# Patient Record
Sex: Male | Born: 1980
Health system: Southern US, Community
[De-identification: ages and names within clinical notes are randomized; demographics above are authoritative.]

## PROBLEM LIST (undated history)

## (undated) HISTORY — PX: LUNG SURGERY: SHX703

## (undated) HISTORY — PX: KNEE SURGERY: SHX244

---

## 2013-02-05 LAB — LIPID PANEL
CHOLESTEROL: 168 mg/dL (ref 0–200)
HDL: 39 mg/dL (ref 35–70)
LDL Cholesterol: 110 mg/dL
TRIGLYCERIDES: 96 mg/dL (ref 40–160)

## 2013-06-21 LAB — BASIC METABOLIC PANEL
BUN: 13 mg/dL (ref 4–21)
Creatinine: 1.1 mg/dL (ref 0.6–1.3)
Glucose: 88 mg/dL
Potassium: 4.5 mmol/L (ref 3.4–5.3)
Sodium: 143 mmol/L (ref 137–147)

## 2013-06-21 LAB — CBC AND DIFFERENTIAL
HCT: 45 % (ref 41–53)
Hemoglobin: 15.6 g/dL (ref 13.5–17.5)
Platelets: 292 10*3/uL (ref 150–399)
WBC: 6.9 10*3/mL

## 2013-06-21 LAB — TSH: TSH: 1.67 u[IU]/mL (ref 0.41–5.90)

## 2013-06-21 LAB — HEPATIC FUNCTION PANEL
ALT: 35 U/L (ref 10–40)
AST: 23 U/L (ref 14–40)

## 2013-06-21 LAB — HEMOGLOBIN A1C: HEMOGLOBIN A1C: 5.2 % (ref 4.0–6.0)

## 2014-10-29 ENCOUNTER — Other Ambulatory Visit: Payer: Self-pay | Admitting: Family Medicine

## 2014-10-29 DIAGNOSIS — M545 Low back pain: Secondary | ICD-10-CM

## 2014-12-06 DIAGNOSIS — R42 Dizziness and giddiness: Secondary | ICD-10-CM | POA: Insufficient documentation

## 2014-12-06 DIAGNOSIS — F32A Depression, unspecified: Secondary | ICD-10-CM | POA: Insufficient documentation

## 2014-12-06 DIAGNOSIS — R079 Chest pain, unspecified: Secondary | ICD-10-CM | POA: Insufficient documentation

## 2014-12-06 DIAGNOSIS — J309 Allergic rhinitis, unspecified: Secondary | ICD-10-CM | POA: Insufficient documentation

## 2014-12-06 DIAGNOSIS — F419 Anxiety disorder, unspecified: Secondary | ICD-10-CM | POA: Insufficient documentation

## 2014-12-06 DIAGNOSIS — F329 Major depressive disorder, single episode, unspecified: Secondary | ICD-10-CM | POA: Insufficient documentation

## 2014-12-06 DIAGNOSIS — IMO0001 Reserved for inherently not codable concepts without codable children: Secondary | ICD-10-CM | POA: Insufficient documentation

## 2014-12-06 DIAGNOSIS — R030 Elevated blood-pressure reading, without diagnosis of hypertension: Secondary | ICD-10-CM

## 2014-12-06 DIAGNOSIS — B360 Pityriasis versicolor: Secondary | ICD-10-CM | POA: Insufficient documentation

## 2014-12-06 DIAGNOSIS — R739 Hyperglycemia, unspecified: Secondary | ICD-10-CM | POA: Insufficient documentation

## 2014-12-10 ENCOUNTER — Encounter: Payer: Self-pay | Admitting: Family Medicine

## 2014-12-17 ENCOUNTER — Ambulatory Visit (INDEPENDENT_AMBULATORY_CARE_PROVIDER_SITE_OTHER): Payer: BLUE CROSS/BLUE SHIELD | Admitting: Family Medicine

## 2014-12-17 ENCOUNTER — Encounter: Payer: Self-pay | Admitting: Family Medicine

## 2014-12-17 VITALS — BP 128/60 | HR 63 | Temp 98.5°F | Resp 16 | Ht 71.5 in | Wt 200.0 lb

## 2014-12-17 DIAGNOSIS — Z0000 Encounter for general adult medical examination without abnormal findings: Secondary | ICD-10-CM | POA: Diagnosis not present

## 2014-12-17 DIAGNOSIS — F329 Major depressive disorder, single episode, unspecified: Secondary | ICD-10-CM | POA: Diagnosis not present

## 2014-12-17 DIAGNOSIS — M545 Low back pain, unspecified: Secondary | ICD-10-CM | POA: Insufficient documentation

## 2014-12-17 DIAGNOSIS — M7652 Patellar tendinitis, left knee: Secondary | ICD-10-CM | POA: Diagnosis not present

## 2014-12-17 DIAGNOSIS — F32A Depression, unspecified: Secondary | ICD-10-CM

## 2014-12-17 DIAGNOSIS — E559 Vitamin D deficiency, unspecified: Secondary | ICD-10-CM | POA: Diagnosis not present

## 2014-12-17 DIAGNOSIS — F419 Anxiety disorder, unspecified: Secondary | ICD-10-CM | POA: Diagnosis not present

## 2014-12-17 NOTE — Progress Notes (Signed)
Patient: Brian Kirk, Male    DOB: 1980-11-14, 34 y.o.   MRN: [id number] Visit Date: 12/17/2014  Today's Provider: Mila Merry, MD   Chief Complaint  Patient presents with  . Annual Exam  . Anxiety    follow up  . Depression    follow up  . Hyperglycemia    follow up   Subjective:    Annual physical exam Brian Kirk is a 34 y.o. male who presents today for health maintenance and complete physical. He feels well. He reports exercising  3 times a week. He reports he is sleeping fairly well.  -----------------------------------------------------------------   Anxiety/ Depression Follow up: Last visit was 2 months ago and no changes were made. Patient is currently taking Sertraline  daily. Patient reports good compliance with treatment, good tolerance and good symptoms control. He states his mood has been very good on this medication and feels much less anxious.   Review of Systems  Constitutional: Negative for fever, chills, appetite change and fatigue.  HENT: Negative for congestion, ear pain, hearing loss, nosebleeds and trouble swallowing.   Eyes: Negative for pain and visual disturbance.  Respiratory: Negative for cough, chest tightness and shortness of breath.   Cardiovascular: Negative for chest pain, palpitations and leg swelling.  Gastrointestinal: Negative for nausea, vomiting, abdominal pain, diarrhea, constipation and blood in stool.  Endocrine: Negative for polydipsia, polyphagia and polyuria.  Genitourinary: Negative for dysuria and flank pain.  Musculoskeletal: Positive for back pain and arthralgias (left knee pain; started 1 month ago). Negative for myalgias, joint swelling and neck stiffness.  Skin: Negative for color change, rash and wound.  Neurological: Negative for dizziness, tremors, seizures, speech difficulty, weakness, light-headedness and headaches.  Psychiatric/Behavioral: Negative for behavioral problems, confusion, sleep disturbance,  dysphoric mood and decreased concentration. The patient is not nervous/anxious.   All other systems reviewed and are negative.   Social History He  reports that he has never smoked. He does not have any smokeless tobacco history on file. He reports that he drinks alcohol. He reports that he does not use illicit drugs.  Patient Active Problem List   Diagnosis Date Noted  . Allergic rhinitis 12/06/2014  . Anxiety 12/06/2014  . Chest pain 12/06/2014  . Clinical depression 12/06/2014  . Dizziness 12/06/2014  . Blood pressure elevated 12/06/2014  . Elevated blood sugar 12/06/2014  . Chromophytosis 12/06/2014    Past Surgical History  Procedure Laterality Date  . Knee surgery  Age 65 or 36    Orthoscopic  . Lung surgery      Lungs collapsed as a child    Family History His family history includes Breast cancer in his mother; Healthy in his sister; Hypertension in his father; Kidney disease in his father; Testicular cancer in his father.    Allergies  Allergen Reactions  . Tomato Flavor  [Flavoring Agent] Rash    Previous Medications   CHOLECALCIFEROL (VITAMIN D) 2000 UNITS CAPS    Take 2 capsules by mouth daily.   CYANOCOBALAMIN 1000 MCG TABLET    Take 1 tablet by mouth daily.   MULTIPLE VITAMIN PO    Take 1 capsule by mouth daily.   NAPROXEN (NAPROSYN) 500 MG TABLET    Take by mouth.   SERTRALINE (ZOLOFT) 100 MG TABLET    Take 1 tablet by mouth daily.   TIZANIDINE (ZANAFLEX) 4 MG TABLET    Take by mouth.    Patient Care Team: Malva Limes, MD as PCP -  General (Family Medicine)     Objective:   Vitals: BP 128/60 mmHg  Pulse 63  Temp(Src) 98.5 F (36.9 C) (Oral)  Resp 16  Ht 5' 11.5" (1.816 m)  Wt 200 lb (90.719 kg)  BMI 27.51 kg/m2  SpO2 97%   Physical Exam  General Appearance:    Alert, cooperative, no distress, appears stated age  Head:    Normocephalic, without obvious abnormality, atraumatic  Eyes:    PERRL, conjunctiva/corneas clear, EOM's intact,  fundi    benign, both eyes       Ears:    Normal TM's and external ear canals, both ears  Nose:   Nares normal, septum midline, mucosa normal, no drainage   or sinus tenderness  Throat:   Lips, mucosa, and tongue normal; teeth and gums normal  Neck:   Supple, symmetrical, trachea midline, no adenopathy;       thyroid:  No enlargement/tenderness/nodules; no carotid   bruit or JVD  Back:     Symmetric, no curvature, ROM normal, no CVA tenderness  Lungs:     Clear to auscultation bilaterally, respirations unlabored  Chest wall:    No tenderness or deformity  Heart:    Regular rate and rhythm, S1 and S2 normal, no murmur, rub   or gallop  Abdomen:     Soft, non-tender, bowel sounds active all four quadrants,    no masses, no organomegaly  Genitalia:    normal  Rectal:    deferred  Extremities:   Extremities normal, atraumatic, no cyanosis or edema. Tender left patellar tendon at inferior insertion to patella. Pain reproduced with extension against resistance.   Pulses:   2+ and symmetric all extremities  Skin:   Skin color, texture, turgor normal, no rashes or lesions  Lymph nodes:   Cervical, supraclavicular, and axillary nodes normal  Neurologic:   CNII-XII intact. Normal strength, sensation and reflexes      throughout     Depression Screen PHQ 2/9 Scores 12/17/2014  PHQ - 2 Score 0  PHQ- 9 Score 0      Assessment & Plan:     Routine Health Maintenance and Physical Exam  Exercise Activities and Dietary recommendations Goals    None      Immunization History  Administered Date(s) Administered  . Tdap 01/26/2013    Health Maintenance  Topic Date Due  . HIV Screening  11/16/1997  . INFLUENZA VACCINE  12/30/2014  . TETANUS/TDAP  01/27/2023      Discussed health benefits of physical activity, and encouraged him to engage in regular exercise appropriate for his age and condition.    --------------------------------------------------------------------  1. Annual  physical exam  - Basic metabolic panel - Lipid panel - HIV antibody (with reflex)  2. Depression Has done well on sertraline and is going to reduce to 1/2 tablet daily for awhile.   3. Anxiety   4. Bilateral low back pain without sciatica He has order for XR and plans on going to [facility]  5. Vitamin D deficiency Doing well on vitamin D supplements - Vit D  25 hydroxy (rtn osteoporosis monitoring)  6. Patellar tendonitis of left knee Recommend frequent icing throughout the day. Consider orthopedic referral if conservative measures are not effective.

## 2015-01-26 ENCOUNTER — Other Ambulatory Visit: Payer: Self-pay | Admitting: Family Medicine

## 2015-04-09 ENCOUNTER — Encounter: Payer: Self-pay | Admitting: Family Medicine

## 2015-04-09 ENCOUNTER — Ambulatory Visit
Admission: RE | Admit: 2015-04-09 | Discharge: 2015-04-09 | Disposition: A | Discharge status: Home / Self Care | Payer: BLUE CROSS/BLUE SHIELD | Source: Ambulatory Visit | Attending: Family Medicine | Admitting: Family Medicine

## 2015-04-09 ENCOUNTER — Ambulatory Visit (INDEPENDENT_AMBULATORY_CARE_PROVIDER_SITE_OTHER): Payer: BLUE CROSS/BLUE SHIELD | Admitting: Family Medicine

## 2015-04-09 VITALS — BP 104/66 | HR 101 | Temp 99.7°F | Resp 16 | Ht 71.0 in | Wt 203.0 lb

## 2015-04-09 DIAGNOSIS — M545 Low back pain, unspecified: Secondary | ICD-10-CM

## 2015-04-09 DIAGNOSIS — Z113 Encounter for screening for infections with a predominantly sexual mode of transmission: Secondary | ICD-10-CM | POA: Diagnosis not present

## 2015-04-09 DIAGNOSIS — E559 Vitamin D deficiency, unspecified: Secondary | ICD-10-CM

## 2015-04-09 DIAGNOSIS — Z13220 Encounter for screening for lipoid disorders: Secondary | ICD-10-CM

## 2015-04-09 DIAGNOSIS — R509 Fever, unspecified: Secondary | ICD-10-CM | POA: Diagnosis not present

## 2015-04-09 LAB — POCT URINALYSIS DIPSTICK
Blood, UA: NEGATIVE
Glucose, UA: NEGATIVE
Leukocytes, UA: NEGATIVE
Nitrite, UA: NEGATIVE
Protein, UA: 100
SPEC GRAV UA: 1.025
Urobilinogen, UA: 1
pH, UA: 6

## 2015-04-09 LAB — POCT INFLUENZA A/B
INFLUENZA A, POC: NEGATIVE
Influenza B, POC: NEGATIVE

## 2015-04-09 NOTE — Progress Notes (Signed)
Subjective:     Patient ID: Brian Kirk, male   DOB: 1980-12-12, 34 y.o.   MRN: [id number]  HPI  Chief Complaint  Patient presents with  . Back Pain    Patient comes in office today with conerns of lower back pain for the past two weeks, patient denies any injury or accident. He reports that last night he developed fever and body chills, he denies cold like symptoms or urinary/GI upset.   Reports malaise with intermittent back pain for the last two weeks. In the last 24 hours reports fever as high as 103-104 with chills and acute exacerbation of his lower back pain-"It dropped me to my knees"- and paresthesias of his bilateral thighs. 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 Did not complete screening labs after physical this past summer or LS spine x-ray. No flu shot this season.   Review of Systems  Genitourinary: Negative for dysuria.       Objective:   Physical Exam  Constitutional: He appears well-developed and well-nourished. No distress.  Pulmonary/Chest: Breath sounds normal.  Abdominal: Soft. There is no tenderness. There is no guarding.  Musculoskeletal:  Mild tenderness over bilateral SI areas with loss of lordosis. Muscle strength in lower extremities 5/5. SLR's to 90 degrees without radiation of back pain.       Assessment:    1. Fever and chills - POCT urinalysis dipstick - CBC with Differential/Platelet - Sedimentation rate - Comprehensive metabolic panel - POCT Influenza A/B  2. Bilateral low back pain without sciatica - CBC with Differential/Platelet - Sedimentation rate - Comprehensive metabolic panel  3. Screening for hyperlipidemia - Lipid panel  4. Screen for STD (sexually transmitted disease) - HIV antibody    Plan:    Discussed use of ibuprofen for his sx pending review of lab work.

## 2015-04-09 NOTE — Patient Instructions (Addendum)
We will call you with lab and x-ray results. Try ibuprofen 400 mg 3 x day for back pain pending review of x-ray and labs.

## 2015-04-10 ENCOUNTER — Other Ambulatory Visit: Payer: Self-pay | Admitting: Family Medicine

## 2015-04-10 DIAGNOSIS — B349 Viral infection, unspecified: Secondary | ICD-10-CM

## 2015-04-10 LAB — COMPREHENSIVE METABOLIC PANEL
ALK PHOS: 60 IU/L (ref 39–117)
ALT: 27 IU/L (ref 0–44)
AST: 21 IU/L (ref 0–40)
Albumin/Globulin Ratio: 1.9 (ref 1.1–2.5)
Albumin: 4.6 g/dL (ref 3.5–5.5)
BUN/Creatinine Ratio: 10 (ref 8–19)
BUN: 13 mg/dL (ref 6–20)
Bilirubin Total: 1.1 mg/dL (ref 0.0–1.2)
CO2: 27 mmol/L (ref 18–29)
CREATININE: 1.24 mg/dL (ref 0.76–1.27)
Calcium: 9.7 mg/dL (ref 8.7–10.2)
Chloride: 98 mmol/L (ref 97–106)
GFR calc Af Amer: 88 mL/min/{1.73_m2} (ref >59)
GFR calc non Af Amer: 76 mL/min/{1.73_m2} (ref >59)
GLOBULIN, TOTAL: 2.4 g/dL (ref 1.5–4.5)
Glucose: 101 mg/dL — ABNORMAL HIGH (ref 65–99)
POTASSIUM: 4.7 mmol/L (ref 3.5–5.2)
SODIUM: 139 mmol/L (ref 136–144)
Total Protein: 7 g/dL (ref 6.0–8.5)

## 2015-04-10 LAB — LIPID PANEL
CHOL/HDL RATIO: 3.7 ratio (ref 0.0–5.0)
Cholesterol, Total: 157 mg/dL (ref 100–199)
HDL: 43 mg/dL (ref >39)
LDL CALC: 92 mg/dL (ref 0–99)
Triglycerides: 108 mg/dL (ref 0–149)
VLDL CHOLESTEROL CAL: 22 mg/dL (ref 5–40)

## 2015-04-10 LAB — CBC WITH DIFFERENTIAL/PLATELET
BASOS ABS: 0 10*3/uL (ref 0.0–0.2)
Basos: 0 %
EOS (ABSOLUTE): 0 10*3/uL (ref 0.0–0.4)
EOS: 0 %
HEMATOCRIT: 47 % (ref 37.5–51.0)
Hemoglobin: 16 g/dL (ref 12.6–17.7)
Immature Grans (Abs): 0 10*3/uL (ref 0.0–0.1)
Immature Granulocytes: 0 %
LYMPHS ABS: 0.6 10*3/uL — AB (ref 0.7–3.1)
Lymphs: 7 %
MCH: 29.4 pg (ref 26.6–33.0)
MCHC: 34 g/dL (ref 31.5–35.7)
MCV: 86 fL (ref 79–97)
MONOS ABS: 1 10*3/uL — AB (ref 0.1–0.9)
Monocytes: 11 %
Neutrophils Absolute: 7 10*3/uL (ref 1.4–7.0)
Neutrophils: 82 %
PLATELETS: 247 10*3/uL (ref 150–379)
RBC: 5.44 x10E6/uL (ref 4.14–5.80)
RDW: 13.6 % (ref 12.3–15.4)
WBC: 8.6 10*3/uL (ref 3.4–10.8)

## 2015-04-10 LAB — VITAMIN D 25 HYDROXY (VIT D DEFICIENCY, FRACTURES): VIT D 25 HYDROXY: 34.9 ng/mL (ref 30.0–100.0)

## 2015-04-10 LAB — HIV ANTIBODY (ROUTINE TESTING W REFLEX): HIV SCREEN 4TH GENERATION: NONREACTIVE

## 2015-04-10 LAB — SEDIMENTATION RATE: SED RATE: 2 mm/h (ref 0–15)

## 2015-04-10 MED ORDER — OSELTAMIVIR PHOSPHATE 75 MG PO CAPS: 75.0000 mg | ORAL_CAPSULE | Freq: Two times a day (BID) | ORAL | Status: DC

## 2016-03-05 ENCOUNTER — Other Ambulatory Visit: Payer: Self-pay | Admitting: Family Medicine

## 2016-06-28 ENCOUNTER — Telehealth: Payer: Self-pay | Admitting: Family Medicine

## 2016-06-28 DIAGNOSIS — B349 Viral infection, unspecified: Secondary | ICD-10-CM

## 2016-06-28 MED ORDER — OSELTAMIVIR PHOSPHATE 75 MG PO CAPS: 75.0000 mg | ORAL_CAPSULE | Freq: Every day | ORAL | 0 refills | Status: AC

## 2016-06-28 NOTE — Telephone Encounter (Signed)
Pt wife called stating pt daughter was Dx yesterday with the flu.  Pt wife is requesting a Rx for [place] flu.  [facility] [address]  CB#[phone]/MW

## 2016-06-28 NOTE — Telephone Encounter (Signed)
Please review. Thanks!  

## 2016-08-08 ENCOUNTER — Other Ambulatory Visit: Payer: Self-pay | Admitting: Family Medicine

## 2016-09-22 ENCOUNTER — Ambulatory Visit (INDEPENDENT_AMBULATORY_CARE_PROVIDER_SITE_OTHER): Payer: Managed Care, Other (non HMO) | Admitting: Family Medicine

## 2016-09-22 ENCOUNTER — Encounter: Payer: Self-pay | Admitting: Family Medicine

## 2016-09-22 VITALS — BP 124/84 | HR 86 | Temp 98.6°F | Resp 16 | Ht 71.0 in | Wt 213.0 lb

## 2016-09-22 DIAGNOSIS — F419 Anxiety disorder, unspecified: Secondary | ICD-10-CM | POA: Diagnosis not present

## 2016-09-22 DIAGNOSIS — Z0000 Encounter for general adult medical examination without abnormal findings: Secondary | ICD-10-CM

## 2016-09-22 DIAGNOSIS — B356 Tinea cruris: Secondary | ICD-10-CM

## 2016-09-22 MED ORDER — ALPRAZOLAM 0.5 MG PO TABS: 0.5000 mg | ORAL_TABLET | Freq: Three times a day (TID) | ORAL | 0 refills | Status: DC | PRN

## 2016-09-22 MED ORDER — CLOTRIMAZOLE-BETAMETHASONE 1-0.05 % EX CREA: 1.0000 "application " | TOPICAL_CREAM | Freq: Two times a day (BID) | CUTANEOUS | 0 refills | Status: DC

## 2016-09-22 MED ORDER — SERTRALINE HCL 100 MG PO TABS: 100.0000 mg | ORAL_TABLET | Freq: Every day | ORAL | 12 refills | Status: DC

## 2016-09-22 NOTE — Progress Notes (Signed)
Patient: Brian Kirk, Male    DOB: 01/29/1981, 36 y.o.   MRN: [id number] Visit Date: 09/22/2016  Today's Provider: Mila Merry, MD   Chief Complaint  Patient presents with  . Annual Exam  . Anxiety  . Depression   Subjective:    Annual physical exam Brian Kirk is a 35 y.o. male who presents today for health maintenance and complete physical. He feels well. He reports exercising yes/gym. He reports he is sleeping well.  ---------------------------------------------------------------   Depression He continues on sertraline daily which he states is well tolerated and that his wife has advised him helps his mood quite a bit. He wishes to continue on same dose of medication.      Review of Systems  Constitutional: Negative.   HENT: Positive for congestion and sinus pressure.   Eyes: Negative.   Respiratory: Negative.   Cardiovascular: Negative.   Gastrointestinal: Negative.   Endocrine: Negative.   Genitourinary: Negative.   Musculoskeletal: Negative.   Skin: Negative.   Allergic/Immunologic: Negative.   Neurological: Negative.   Hematological: Negative.   Psychiatric/Behavioral: Negative.     Social History      He  reports that he has never smoked. He has never used smokeless tobacco. He reports that he drinks alcohol. He reports that he does not use drugs.       Social History   Social History  . Marital status: Married    Spouse name: N/A  . Number of children: N/A  . Years of education: N/A   Social History Main Topics  . Smoking status: Never Smoker  . Smokeless tobacco: Never Used  . Alcohol use 0.0 oz/week     Comment: rare  . Drug use: No  . Sexual activity: Not Asked   Other Topics Concern  . None   Social History Narrative  . None    History reviewed. No pertinent past medical history.   Patient Active Problem List   Diagnosis Date Noted  . Low back pain 12/17/2014  . Allergic rhinitis 12/06/2014  . Anxiety 12/06/2014    . Depression 12/06/2014  . Blood pressure elevated 12/06/2014  . Chromophytosis 12/06/2014    Past Surgical History:  Procedure Laterality Date  . KNEE SURGERY  Age 16 or 41   Orthoscopic  . LUNG SURGERY     Lungs collapsed as a child    Family History        Family Status  Relation Status  . Mother Deceased   died during surgery  . Father Alive  . Sister Alive        His family history includes Breast cancer in his mother; Healthy in his sister; Hypertension in his father; Kidney disease in his father; Testicular cancer in his father.     Allergies  Allergen Reactions  . Tomato Flavor  [Flavoring Agent] Rash     Current Outpatient Prescriptions:  .  Cholecalciferol (VITAMIN D) 2000 UNITS CAPS, Take 2 capsules by mouth daily., Disp: , Rfl:  .  cyanocobalamin 1000 MCG tablet, Take 1 tablet by mouth daily., Disp: , Rfl:  .  MULTIPLE VITAMIN PO, Take 1 capsule by mouth daily., Disp: , Rfl:  .  sertraline (ZOLOFT) 100 MG tablet, TAKE 1 TABLET BY MOUTH EVERY DAY, Disp: 30 tablet, Rfl: 0 .  tiZANidine (ZANAFLEX) 4 MG tablet, Take by mouth., Disp: , Rfl:    Patient Care Team: Malva Limes, MD as PCP - General (Family Medicine)  Objective:   Vitals: BP 124/84 (BP Location: Right Arm, Patient Position: Sitting, Cuff Size: Large)   Pulse 86   Temp 98.6 F (37 C) (Oral)   Resp 16   Ht  (1.803 m)   Wt 213 lb (96.6 kg)   SpO2 94%   BMI 29.71 kg/m       Physical Exam   General Appearance:    Alert, cooperative, no distress, appears stated age  Head:    Normocephalic, without obvious abnormality, atraumatic  Eyes:    PERRL, conjunctiva/corneas clear, EOM's intact, fundi    benign, both eyes       Ears:    Normal TM's and external ear canals, both ears  Nose:   Nares normal, septum midline, mucosa normal, no drainage   or sinus tenderness  Throat:   Lips, mucosa, and tongue normal; teeth and gums normal  Neck:   Supple, symmetrical, trachea midline,  no adenopathy;       thyroid:  No enlargement/tenderness/nodules; no carotid   bruit or JVD  Back:     Symmetric, no curvature, ROM normal, no CVA tenderness  Lungs:     Clear to auscultation bilaterally, respirations unlabored  Chest wall:    No tenderness or deformity  Heart:    Regular rate and rhythm, S1 and S2 normal, no murmur, rub   or gallop  Abdomen:     Soft, non-tender, bowel sounds active all four quadrants,    no masses, no organomegaly  Genitalia:    deferred  Rectal:    deferred  Extremities:   Extremities normal, atraumatic, no cyanosis or edema  Pulses:   2+ and symmetric all extremities  Skin:   Skin color, texture, turgor normal, no rashes or lesions. Mildly inflamed bilateral groin area.   Lymph nodes:   Cervical, supraclavicular, and axillary nodes normal  Neurologic:   CNII-XII intact. Normal strength, sensation and reflexes      throughout    Depression Screen PHQ 2/9 Scores 09/22/2016 12/17/2014  PHQ - 2 Score 3 0  PHQ- 9 Score 15 0      Assessment & Plan:     Routine Health Maintenance and Physical Exam  Exercise Activities and Dietary recommendations Goals    None      Immunization History  Administered Date(s) Administered  . Tdap 01/26/2013    Health Maintenance  Topic Date Due  . INFLUENZA VACCINE  12/29/2016  . TETANUS/TDAP  01/27/2023  . HIV Screening  Completed     Discussed health benefits of physical activity, and encouraged him to engage in regular exercise appropriate for his age and condition.    --------------------------------------------------------------------  1. Annual physical exam Normal anxiety   2. Anxiety Rarely takes alprazolam, has been several years since last refill and he is now out.  - ALPRAZolam (XANAX) 0.5 MG tablet; Take 1 tablet (0.5 mg total) by mouth every 8 (eight) hours as needed.  Dispense: 30 tablet; Refill: 0 - sertraline (ZOLOFT) 100 MG tablet; Take 1 tablet (100 mg total) by mouth daily.   Dispense: 30 tablet; Refill: 12  3. Tinea cruris  - clotrimazole-betamethasone (LOTRISONE) cream; Apply 1 application topically 2 (two) times daily.  Dispense: 30 g; Refill: 0     Mila Merry, MD  [facility]

## 2017-02-25 ENCOUNTER — Other Ambulatory Visit: Payer: Self-pay | Admitting: Family Medicine

## 2017-02-25 DIAGNOSIS — F419 Anxiety disorder, unspecified: Secondary | ICD-10-CM

## 2017-02-25 MED ORDER — SERTRALINE HCL 100 MG PO TABS: 100.0000 mg | ORAL_TABLET | Freq: Every day | ORAL | 2 refills | Status: DC

## 2017-02-25 NOTE — Telephone Encounter (Signed)
[facility] faxed a request for a 90-day supply for the following medication. Thanks CC  sertraline (ZOLOFT) 100 MG tablet

## 2017-08-18 ENCOUNTER — Encounter: Payer: Self-pay | Admitting: Family Medicine

## 2017-08-18 ENCOUNTER — Ambulatory Visit (INDEPENDENT_AMBULATORY_CARE_PROVIDER_SITE_OTHER): Payer: Managed Care, Other (non HMO) | Admitting: Family Medicine

## 2017-08-18 DIAGNOSIS — R51 Headache: Secondary | ICD-10-CM

## 2017-08-18 DIAGNOSIS — R519 Headache, unspecified: Secondary | ICD-10-CM

## 2017-08-18 MED ORDER — AMITRIPTYLINE HCL 10 MG PO TABS: 10.0000 mg | ORAL_TABLET | Freq: Every day | ORAL | 1 refills | Status: DC

## 2017-08-18 NOTE — Progress Notes (Signed)
Dictation #1 [id number]  [id number]       Patient: Brian Kirk Male    DOB: [birth year]   37 y.o.   MRN: [id number] Visit Date: 08/18/2017  Today's Provider: Mila Merryonald Sylver Vantassell, MD   Chief Complaint  Patient presents with  . Headache   Subjective:    Headache   Associated symptoms include dizziness. Pertinent negatives include no abdominal pain, fever, nausea or vomiting.   He states he started having migraines in his early 6830s, but have been more frequent and much more severe the last 7-8 months. They are always behind his left eye and associated with blurry vision and dizziness. He had thorough exam which he reports as being normal. + photophobia. They now occur 2-3 days a week and last several hours. Sometimes improve when he lies down in a  Dark room. Headaches occasionally wake him at night. No numbness or tingling of extremities. No muscle weakness.      Allergies  Allergen Reactions  . Tomato Flavor  [Flavoring Agent] Rash     Current Outpatient Medications:  .  ALPRAZolam (XANAX) 0.5 MG tablet, Take 1 tablet (0.5 mg total) by mouth every 8 (eight) hours as needed., Disp: 30 tablet, Rfl: 0 .  Cholecalciferol (VITAMIN D) 2000 UNITS CAPS, Take 2 capsules by mouth daily., Disp: , Rfl:  .  cyanocobalamin 1000 MCG tablet, Take 1 tablet by mouth daily., Disp: , Rfl:  .  MULTIPLE VITAMIN PO, Take 1 capsule by mouth daily., Disp: , Rfl:  .  sertraline (ZOLOFT) 100 MG tablet, Take 1 tablet (100 mg total) by mouth daily. (Patient not taking: Reported on 08/18/2017), Disp: 90 tablet, Rfl: 2  Review of Systems  Constitutional: Negative for appetite change, chills and fever.  Respiratory: Negative for chest tightness, shortness of breath and wheezing.   Cardiovascular: Negative for chest pain and palpitations.  Gastrointestinal: Negative for abdominal pain, nausea and vomiting.  Neurological: Positive for dizziness and headaches.    Social History   Tobacco Use  . Smoking  status: Never Smoker  . Smokeless tobacco: Never Used  Substance Use Topics  . Alcohol use: Yes    Alcohol/week: 0.0 oz    Comment: rare   Objective:   BP 128/80 (BP Location: Left Arm, Patient Position: Sitting, Cuff Size: Large)   Pulse 72   Temp 98.2 F (36.8 C)   Resp 16   Wt 215 lb (97.5 kg)   SpO2 96%   BMI 29.99 kg/m  Vitals:   08/18/17 1006  BP: 128/80  Pulse: 72  Resp: 16  Temp: 98.2 F (36.8 C)  SpO2: 96%  Weight: 215 lb (97.5 kg)     Physical Exam   General Appearance:    Alert, cooperative, no distress  Eyes:    PERRL, conjunctiva/corneas clear, EOM's intact       Lungs:     Clear to auscultation bilaterally, respirations unlabored  Heart:    Regular rate and rhythm  Neurologic:   Awake, alert, oriented x 3. No apparent focal neurological           defect.           Assessment & Plan:     1. Intractable episodic headache, unspecified headache type Relative recent onset with change in severity and frequency. Needs neuroimaging to rule out structural etiology.  - MR Brain W Wo Contrast; Future - amitriptyline (ELAVIL) 10 MG tablet; Take 1 tablet (10 mg total) by mouth at bedtime. For 4 nights, then  increase to 2 at bedtime for 4 nights, then 3 at bedtime  Dispense: 60 tablet; Refill: 1       Mila Merry, MD  [facility]

## 2017-08-18 NOTE — Patient Instructions (Signed)
Recurrent Migraine Headache °A migraine headache is very bad, throbbing pain that is usually on one side of your head. Recurrent migraines keep coming back (recurring). Talk with your doctor about what things may bring on (trigger) your migraine headaches. °Follow these instructions at home: °Medicines  °· Take over-the-counter and prescription medicines only as told by your doctor. °· Do not drive or use heavy machinery while taking prescription pain medicine. °Lifestyle  °· Do not use any products that contain nicotine or tobacco, such as cigarettes and e-cigarettes. If you need help quitting, ask your doctor. °· Limit alcohol intake to no more than 1 drink a day for nonpregnant women and 2 drinks a day for men. One drink equals 12 oz of beer, 5 oz of wine, or 1½ oz of hard liquor. °· Get 7-9 hours of sleep each night. °· Lessen any stress in your life. Ask your doctor about ways to lower your stress. °· Stay at a healthy weight. Talk with your doctor if you need help losing weight. °· Get regular exercise. °General instructions  °· Keep a journal to find out if certain things bring on migraine headaches. For example, write down: °¨ What you eat and drink. °¨ How much sleep you get. °¨ Any change to your diet or medicines. °· Lie down in a dark, quiet room when you have a migraine. °· Try placing a cool towel over your head when you have a migraine. °· Keep lights dim if bright lights bother you or make your migraines worse. °· Keep all follow-up visits as told by your doctor. This is important. °Contact a doctor if: °· Medicine does not help your migraines. °· Your pain keeps coming back. °· You have a fever. °· You have weight loss without trying. °Get help right away if: °· Your migraine becomes really bad and medicine does not help. °· You have a stiff neck. °· You have trouble seeing. °· Your muscles are weak or you lose control of your muscles. °· You lose your balance or have trouble walking. °· You feel  like you will pass out (faint) or you pass out. °· You have really bad symptoms that are different than your first symptoms. °· You start having sudden, very bad headaches that last for one second or less, like a thunderclap. °Summary °· A migraine headache is very bad, throbbing pain that is usually on one side of your head. °· Talk with your doctor about what things may bring on (trigger) your migraine headaches. °· Take over-the-counter and prescription medicines only as told by your doctor. °· Lie down in a dark, quiet room when you have a migraine. °· Keep a journal about what you eat and drink, how much sleep you get, and any changes to your medicines. This can help you find out if certain things make you have migraine headaches. °This information is not intended to replace advice given to you by your health care provider. Make sure you discuss any questions you have with your health care provider. °Document Released: 02/24/2008 Document Revised: 04/09/2016 Document Reviewed: 04/09/2016 °Elsevier Interactive Patient Education © 2017 [employer] ° °

## 2017-09-06 ENCOUNTER — Ambulatory Visit
Admission: RE | Admit: 2017-09-06 | Discharge: 2017-09-06 | Disposition: A | Discharge status: Home / Self Care | Payer: Managed Care, Other (non HMO) | Source: Ambulatory Visit | Attending: Family Medicine | Admitting: Family Medicine

## 2017-09-06 DIAGNOSIS — R51 Headache: Secondary | ICD-10-CM | POA: Insufficient documentation

## 2017-09-06 DIAGNOSIS — R519 Headache, unspecified: Secondary | ICD-10-CM

## 2017-09-06 MED ORDER — GADOBENATE DIMEGLUMINE 529 MG/ML IV SOLN
20.0000 mL | Freq: Once | INTRAVENOUS | Status: AC | PRN
  Administered 2017-09-06: 19 mL via INTRAVENOUS

## 2017-09-12 ENCOUNTER — Telehealth: Payer: Self-pay | Admitting: Emergency Medicine

## 2017-09-12 DIAGNOSIS — R51 Headache: Principal | ICD-10-CM

## 2017-09-12 DIAGNOSIS — R519 Headache, unspecified: Secondary | ICD-10-CM

## 2017-09-12 NOTE — Telephone Encounter (Signed)
Pt informed. Pt reports that the medications helps but it makes him too groggy the next day. He can not take the medication daily so his headaches are about the same.

## 2017-09-12 NOTE — Telephone Encounter (Signed)
-----   Message from Malva Limesonald E Fisher, MD sent at 09/09/2017  7:55 AM EDT ----- MRI is normal. Please see if headaches are better since starting amitriptyline and how much he is taking.

## 2017-09-13 NOTE — Telephone Encounter (Signed)
Try change to topiramate 25mg  tab, 1 at bedtime for 1week, then 1 twice a day for 1week, then one QAM and 2 QPM. #60, rf x 0. Follow up o.v. In 3-4 weeks.

## 2017-09-13 NOTE — Telephone Encounter (Signed)
LMOVM for pt to return call 

## 2017-09-15 MED ORDER — TOPIRAMATE 25 MG PO TABS: ORAL_TABLET | ORAL | 0 refills | Status: DC

## 2017-09-15 NOTE — Telephone Encounter (Signed)
Patient advised and agrees to try changing medication. Prescription sent into the pharmacy. Follow up appointment scheduled 10/14/2017 at 2pm.

## 2017-10-10 ENCOUNTER — Encounter: Payer: Self-pay | Admitting: Family Medicine

## 2017-10-10 ENCOUNTER — Ambulatory Visit (INDEPENDENT_AMBULATORY_CARE_PROVIDER_SITE_OTHER): Payer: Managed Care, Other (non HMO) | Admitting: Family Medicine

## 2017-10-10 VITALS — BP 122/82 | HR 117 | Temp 100.2°F | Resp 16 | Wt 210.0 lb

## 2017-10-10 DIAGNOSIS — J020 Streptococcal pharyngitis: Secondary | ICD-10-CM | POA: Diagnosis not present

## 2017-10-10 LAB — POCT RAPID STREP A (OFFICE): RAPID STREP A SCREEN: POSITIVE — AB

## 2017-10-10 MED ORDER — AMOXICILLIN 250 MG/5ML PO SUSR: 500.0000 mg | Freq: Two times a day (BID) | ORAL | 0 refills | Status: AC

## 2017-10-10 NOTE — Progress Notes (Signed)
Patient: Brian Kirk Male    DOB: 01/06/1981   37 y.o.   MRN: [id number] Visit Date: 10/10/2017  Today's Provider: Mila Merry, MD   No chief complaint on file.  Subjective:    Sore Throat   This is a new problem. Episode onset: 2 days ago. The problem has been gradually worsening. Neither side of throat is experiencing more pain than the other. Associated symptoms include headaches, a hoarse voice, swollen glands and trouble swallowing (hurts to swallow). Pertinent negatives include no abdominal pain, congestion, coughing or vomiting. He has tried nothing for the symptoms.      Allergies  Allergen Reactions  . Tomato Flavor  [Flavoring Agent] Rash     Current Outpatient Medications:  .  ALPRAZolam (XANAX) 0.5 MG tablet, Take 1 tablet (0.5 mg total) by mouth every 8 (eight) hours as needed., Disp: 30 tablet, Rfl: 0 .  amitriptyline (ELAVIL) 10 MG tablet, Take 1 tablet (10 mg total) by mouth at bedtime. For 4 nights, then increase to 2 at bedtime for 4 nights, then 3 at bedtime, Disp: 60 tablet, Rfl: 1 .  Cholecalciferol (VITAMIN D) 2000 UNITS CAPS, Take 2 capsules by mouth daily., Disp: , Rfl:  .  cyanocobalamin 1000 MCG tablet, Take 1 tablet by mouth daily., Disp: , Rfl:  .  MULTIPLE VITAMIN PO, Take 1 capsule by mouth daily., Disp: , Rfl:  .  sertraline (ZOLOFT) 100 MG tablet, Take 1 tablet (100 mg total) by mouth daily., Disp: 90 tablet, Rfl: 2 .  topiramate (TOPAMAX) 25 MG tablet, 1 at bedtime for 1week, then 1 twice a day for 1week, then one QAM and 2 QPM, Disp: 60 tablet, Rfl: 0  Review of Systems  Constitutional: Positive for diaphoresis and fatigue. Negative for appetite change.  HENT: Positive for hoarse voice and trouble swallowing (hurts to swallow). Negative for congestion, sinus pressure, sinus pain and sneezing.   Respiratory: Negative for cough and chest tightness.   Cardiovascular: Negative for palpitations.  Gastrointestinal: Negative for abdominal  pain, nausea and vomiting.  Neurological: Positive for headaches.    Social History   Tobacco Use  . Smoking status: Never Smoker  . Smokeless tobacco: Never Used  Substance Use Topics  . Alcohol use: Yes    Alcohol/week: 0.0 oz    Comment: rare   Objective:   BP 122/82 (BP Location: Left Arm, Patient Position: Sitting, Cuff Size: Large)   Pulse (!) 117   Temp 100.2 F (37.9 C) (Oral)   Resp 16   Wt 210 lb (95.3 kg)   SpO2 98% Comment: room air  BMI 29.29 kg/m     Physical Exam  General Appearance:    Alert, cooperative, no distress  HENT:   bilateral TM normal without fluid or infection, neck has bilateral anterior cervical nodes enlarged, tonsils red, enlarged, without exudate present and sinuses nontender  Eyes:    PERRL, conjunctiva/corneas clear, EOM's intact       Lungs:     Clear to auscultation bilaterally, respirations unlabored  Heart:    Regular rate and rhythm  Neurologic:   Awake, alert, oriented x 3. No apparent focal neurological           defect.       Results for orders placed or performed in visit on 10/10/17  POCT rapid strep A  Result Value Ref Range   Rapid Strep A Screen Positive (A) Negative        Assessment &  Plan:     1. Strep pharyngitis  - POCT rapid strep A - amoxicillin (AMOXIL) 250 MG/5ML suspension; Take 10 mLs (500 mg total) by mouth 2 (two) times daily for 10 days.  Dispense: 150 mL; Refill: 0 . Call if symptoms change or if not rapidly improving.          Mila Merry, MD  [facility]

## 2017-10-14 ENCOUNTER — Ambulatory Visit: Payer: Self-pay | Admitting: Family Medicine

## 2017-10-14 NOTE — Progress Notes (Deleted)
       Patient: Brian Kirk Male    DOB: [birth year]   37 y.o.   MRN: [id number] Visit Date: 10/14/2017  Today's Provider: Mila Merry, MD   No chief complaint on file.  Subjective:    HPI  Intractable episodic headache, unspecified headache type From 08/18/2017-started amitriptyline 10 mg. MRI ordered and was normal. Changes from amitriptyline to topiramate due to mediation making patient feel groggy.    Allergies  Allergen Reactions  . Tomato Flavor  [Flavoring Agent] Rash     Current Outpatient Medications:  .  ALPRAZolam (XANAX) 0.5 MG tablet, Take 1 tablet (0.5 mg total) by mouth every 8 (eight) hours as needed., Disp: 30 tablet, Rfl: 0 .  amitriptyline (ELAVIL) 10 MG tablet, Take 1 tablet (10 mg total) by mouth at bedtime. For 4 nights, then increase to 2 at bedtime for 4 nights, then 3 at bedtime, Disp: 60 tablet, Rfl: 1 .  amoxicillin (AMOXIL) 250 MG/5ML suspension, Take 10 mLs (500 mg total) by mouth 2 (two) times daily for 10 days., Disp: 150 mL, Rfl: 0 .  Cholecalciferol (VITAMIN D) 2000 UNITS CAPS, Take 2 capsules by mouth daily., Disp: , Rfl:  .  cyanocobalamin 1000 MCG tablet, Take 1 tablet by mouth daily., Disp: , Rfl:  .  MULTIPLE VITAMIN PO, Take 1 capsule by mouth daily., Disp: , Rfl:  .  sertraline (ZOLOFT) 100 MG tablet, Take 1 tablet (100 mg total) by mouth daily., Disp: 90 tablet, Rfl: 2 .  topiramate (TOPAMAX) 25 MG tablet, 1 at bedtime for 1week, then 1 twice a day for 1week, then one QAM and 2 QPM, Disp: 60 tablet, Rfl: 0  Review of Systems  Constitutional: Negative for appetite change, chills and fever.  Respiratory: Negative for chest tightness, shortness of breath and wheezing.   Cardiovascular: Negative for chest pain and palpitations.  Gastrointestinal: Negative for abdominal pain, nausea and vomiting.    Social History   Tobacco Use  . Smoking status: Never Smoker  . Smokeless tobacco: Never Used  Substance Use Topics  . Alcohol use: Yes     Alcohol/week: 0.0 oz    Comment: rare   Objective:   There were no vitals taken for this visit. There were no vitals filed for this visit.   Physical Exam      Assessment & Plan:           Mila Merry, MD  [facility]

## 2017-11-10 NOTE — Progress Notes (Signed)
Patient: Brian Kirk, Male    DOB: Dec 07, 1980, 37 y.o.   MRN: [id number] Visit Date: 11/11/2017  Today's Provider: Mila Merry, MD   Chief Complaint  Patient presents with  . Annual Exam  . Headache  . Anxiety   Subjective:    Annual physical exam Brian Kirk is a 37 y.o. male who presents today for health maintenance and complete physical. He feels fairly well. He reports no re regular exercising. He reports he is sleeping fairly well.  -----------------------------------------------------------------  Headaches From 08/18/2017-started amitriptyline (ELAVIL) 10 MG tablet. MRI ordered, normal. Changed from amitriptyline to topiramate due to amitriptyline making pt feel groggy. However he states he never got around to picking up topiramate prescription and would like to try it now.   Anxiety and Depression He states he continues to take sertraline, 100mg , but frequently misses doses of medications. Sometimes he won't take it until his anxiety level and headaches get worse than usual. He states he feels very fatigued all the time and feels like he could fall asleep any time. He constantly feels anxious and has a hard time staying asleep at night.      Review of Systems  Constitutional: Positive for appetite change and fatigue. Negative for chills and fever.  HENT: Negative for congestion, ear pain, hearing loss, nosebleeds and trouble swallowing.   Eyes: Negative for pain and visual disturbance.  Respiratory: Negative for cough, chest tightness and shortness of breath.   Cardiovascular: Negative for chest pain, palpitations and leg swelling.  Gastrointestinal: Negative for abdominal pain, blood in stool, constipation, diarrhea, nausea and vomiting.  Endocrine: Negative for polydipsia, polyphagia and polyuria.  Genitourinary: Negative for dysuria and flank pain.  Musculoskeletal: Negative for arthralgias, back pain, joint swelling, myalgias and neck stiffness.  Skin:  Negative for color change, rash and wound.  Neurological: Negative for dizziness, tremors, seizures, speech difficulty, weakness, light-headedness and headaches.  Psychiatric/Behavioral: Negative for behavioral problems, confusion, decreased concentration, dysphoric mood and sleep disturbance. The patient is nervous/anxious.   All other systems reviewed and are negative.   Social History      He  reports that he has never smoked. He has never used smokeless tobacco. He reports that he drinks alcohol. He reports that he does not use drugs.       Social History   Socioeconomic History  . Marital status: Married    Spouse name: Not on file  . Number of children: Not on file  . Years of education: Not on file  . Highest education level: Not on file  Occupational History  . Occupation: Games developer    Comment: [employer]  Social Needs  . Financial resource strain: Not on file  . Food insecurity:    Worry: Not on file    Inability: Not on file  . Transportation needs:    Medical: Not on file    Non-medical: Not on file  Tobacco Use  . Smoking status: Never Smoker  . Smokeless tobacco: Never Used  Substance and Sexual Activity  . Alcohol use: Yes    Alcohol/week: 0.0 oz    Comment: rare  . Drug use: No  . Sexual activity: Not on file  Lifestyle  . Physical activity:    Days per week: Not on file    Minutes per session: Not on file  . Stress: Not on file  Relationships  . Social connections:    Talks on phone: Not on file    Gets  together: Not on file    Attends religious service: Not on file    Active member of club or organization: Not on file    Attends meetings of clubs or organizations: Not on file    Relationship status: Not on file  Other Topics Concern  . Not on file  Social History Narrative  . Not on file    No past medical history on file.   Patient Active Problem List   Diagnosis Date Noted  . Low back pain 12/17/2014  . Allergic rhinitis 12/06/2014   . Anxiety 12/06/2014  . Depression 12/06/2014  . Blood pressure elevated 12/06/2014  . Chromophytosis 12/06/2014    Past Surgical History:  Procedure Laterality Date  . KNEE SURGERY  Age 38 or 86   Orthoscopic  . LUNG SURGERY     Lungs collapsed as a child    Family History        Family Status  Relation Name Status  . Mother  Deceased       died during surgery  . Father  Alive  . Sister Twin Sister Alive        His family history includes Breast cancer in his mother; Hypertension in his father; Kidney disease in his father and sister; Testicular cancer in his father.      Allergies  Allergen Reactions  . Tomato Flavor  [Flavoring Agent] Rash     Current Outpatient Medications:  .  ALPRAZolam (XANAX) 0.5 MG tablet, Take 1 tablet (0.5 mg total) by mouth every 8 (eight) hours as needed. (Patient not taking: Reported on 11/11/2017), Disp: 30 tablet, Rfl: 0 .  amitriptyline (ELAVIL) 10 MG tablet, Take 1 tablet (10 mg total) by mouth at bedtime. For 4 nights, then increase to 2 at bedtime for 4 nights, then 3 at bedtime (Patient not taking: Reported on 11/11/2017), Disp: 60 tablet, Rfl: 1 .  Cholecalciferol (VITAMIN D) 2000 UNITS CAPS, Take 2 capsules by mouth daily., Disp: , Rfl:  .  cyanocobalamin 1000 MCG tablet, Take 1 tablet by mouth daily., Disp: , Rfl:  .  MULTIPLE VITAMIN PO, Take 1 capsule by mouth daily., Disp: , Rfl:  .  sertraline (ZOLOFT) 100 MG tablet, Take 1 tablet (100 mg total) by mouth daily.  Marland Kitchen  topiramate (TOPAMAX) 25 MG tablet, 1 at bedtime for 1week, then 1 twice a day for 1week, then one QAM and 2 QPM (Patient not taking: Reported on 11/11/2017), Disp: 60 tablet, Rfl: 0   Patient Care Team: Malva Limes, MD as PCP - General (Family Medicine)      Objective:   Vitals: BP 122/82 (BP Location: Left Arm, Patient Position: Sitting, Cuff Size: Large)   Pulse 89   Temp 98.6 F (37 C) (Oral)   Resp 16   Ht 5\' 11"  (1.803 m)   Wt 214 lb (97.1 kg)    SpO2 98% Comment: room air  BMI 29.85 kg/m    Vitals:   11/11/17 1515  BP: 122/82  Pulse: 89  Resp: 16  Temp: 98.6 F (37 C)  TempSrc: Oral  SpO2: 98%  Weight: 214 lb (97.1 kg)  Height: 5\' 11"  (1.803 m)     Physical Exam    Depression Screen PHQ 2/9 Scores 11/11/2017 09/22/2016 12/17/2014  PHQ - 2 Score 2 3 0  PHQ- 9 Score 14 15 0    Audit-C Alcohol Use Screening   Alcohol Use Disorder Test (AUDIT) 11/11/2017  1. How often do you have a  drink containing alcohol? 2  2. How many drinks containing alcohol do you have on a typical day when you are drinking? 0  3. How often do you have six or more drinks on one occasion? 2  AUDIT-C Score 4  4. How often during the last year have you found that you were not able to stop drinking once you had started? 0  5. How often during the last year have you failed to do what was normally expected from you becasue of drinking? 0  6. How often during the last year have you needed a first drink in the morning to get yourself going after a heavy drinking session? 0  7. How often during the last year have you had a feeling of guilt of remorse after drinking? 0  8. How often during the last year have you been unable to remember what happened the night before because you had been drinking? 0  9. Have you or someone else been injured as a result of your drinking? 0  10. Has a relative or friend or a doctor or another health worker been concerned about your drinking or suggested you cut down? 0  Alcohol Use Disorder Identification Test Final Score (AUDIT) 4    A score of 3 or more in women, and 4 or more in men indicates increased risk for alcohol abuse, EXCEPT if all of the points are from question 1   Assessment & Plan:     Routine Health Maintenance and Physical Exam  Exercise Activities and Dietary recommendations Goals    None      Immunization History  Administered Date(s) Administered  . Influenza-Unspecified 03/14/2017  . Tdap  01/26/2013    Health Maintenance  Topic Date Due  . INFLUENZA VACCINE  12/29/2017  . TETANUS/TDAP  01/27/2023  . HIV Screening  Completed     Discussed health benefits of physical activity, and encouraged him to engage in regular exercise appropriate for his age and condition.    --------------------------------------------------------------------  1. Annual physical exam  - Comprehensive metabolic panel - CBC - Lipid panel - Testosterone,Free and Total - TSH  2. Anxiety Is not taking sertraline consistently which he was encourage to do. Also discussed possibly change to SNRI.   3. Depression, unspecified depression type   4. BMI 29.0-29.9,adult   5. Intractable episodic headache, unspecified headache type Likely multifactorial, partially  secondary to stress, anxiety, and insomnia. He did not tolerate amitriptyline, but would like to try topiramate (TOPAMAX) 25 MG tablet; 1 at bedtime for 1 week, then 1 twice a day for 1week, then one QAM and 2 QPM  Dispense: 60 tablet; Refill: 0  6. Excessive daytime sleepiness Check labs as above. Consider sleep study.     Mila Merryonald Mccayla Shimada, MD  [facility]

## 2017-11-11 ENCOUNTER — Ambulatory Visit (INDEPENDENT_AMBULATORY_CARE_PROVIDER_SITE_OTHER): Payer: Managed Care, Other (non HMO) | Admitting: Family Medicine

## 2017-11-11 ENCOUNTER — Encounter: Payer: Self-pay | Admitting: Family Medicine

## 2017-11-11 VITALS — BP 122/82 | HR 89 | Temp 98.6°F | Resp 16 | Ht 71.0 in | Wt 214.0 lb

## 2017-11-11 DIAGNOSIS — Z6829 Body mass index (BMI) 29.0-29.9, adult: Secondary | ICD-10-CM | POA: Diagnosis not present

## 2017-11-11 DIAGNOSIS — G4719 Other hypersomnia: Secondary | ICD-10-CM

## 2017-11-11 DIAGNOSIS — R519 Headache, unspecified: Secondary | ICD-10-CM

## 2017-11-11 DIAGNOSIS — R51 Headache: Secondary | ICD-10-CM | POA: Diagnosis not present

## 2017-11-11 DIAGNOSIS — F329 Major depressive disorder, single episode, unspecified: Secondary | ICD-10-CM

## 2017-11-11 DIAGNOSIS — Z0000 Encounter for general adult medical examination without abnormal findings: Secondary | ICD-10-CM | POA: Diagnosis not present

## 2017-11-11 DIAGNOSIS — F419 Anxiety disorder, unspecified: Secondary | ICD-10-CM | POA: Diagnosis not present

## 2017-11-11 DIAGNOSIS — F32A Depression, unspecified: Secondary | ICD-10-CM

## 2017-11-11 MED ORDER — TOPIRAMATE 25 MG PO TABS: ORAL_TABLET | ORAL | 0 refills | Status: DC

## 2017-11-13 LAB — COMPREHENSIVE METABOLIC PANEL
A/G RATIO: 1.8 (ref 1.2–2.2)
ALK PHOS: 60 IU/L (ref 39–117)
ALT: 34 IU/L (ref 0–44)
AST: 21 IU/L (ref 0–40)
Albumin: 4.6 g/dL (ref 3.5–5.5)
BILIRUBIN TOTAL: 0.5 mg/dL (ref 0.0–1.2)
BUN/Creatinine Ratio: 9 (ref 9–20)
BUN: 10 mg/dL (ref 6–20)
CALCIUM: 9.8 mg/dL (ref 8.7–10.2)
CHLORIDE: 103 mmol/L (ref 96–106)
CO2: 23 mmol/L (ref 20–29)
Creatinine, Ser: 1.16 mg/dL (ref 0.76–1.27)
GFR calc Af Amer: 94 mL/min/{1.73_m2} (ref >59)
GFR calc non Af Amer: 82 mL/min/{1.73_m2} (ref >59)
Globulin, Total: 2.6 g/dL (ref 1.5–4.5)
Glucose: 85 mg/dL (ref 65–99)
POTASSIUM: 3.9 mmol/L (ref 3.5–5.2)
Sodium: 137 mmol/L (ref 134–144)
Total Protein: 7.2 g/dL (ref 6.0–8.5)

## 2017-11-13 LAB — CBC
Hematocrit: 42.5 % (ref 37.5–51.0)
Hemoglobin: 14.7 g/dL (ref 13.0–17.7)
MCH: 29.5 pg (ref 26.6–33.0)
MCHC: 34.6 g/dL (ref 31.5–35.7)
MCV: 85 fL (ref 79–97)
Platelets: 273 10*3/uL (ref 150–450)
RBC: 4.98 x10E6/uL (ref 4.14–5.80)
RDW: 13.5 % (ref 12.3–15.4)
WBC: 7.8 10*3/uL (ref 3.4–10.8)

## 2017-11-13 LAB — LIPID PANEL
CHOLESTEROL TOTAL: 200 mg/dL — AB (ref 100–199)
Chol/HDL Ratio: 6.7 ratio — ABNORMAL HIGH (ref 0.0–5.0)
HDL: 30 mg/dL — ABNORMAL LOW (ref >39)
TRIGLYCERIDES: 424 mg/dL — AB (ref 0–149)

## 2017-11-13 LAB — TESTOSTERONE,FREE AND TOTAL
Testosterone, Free: 4.2 pg/mL — ABNORMAL LOW (ref 8.7–25.1)
Testosterone: 85 ng/dL — ABNORMAL LOW (ref 264–916)

## 2017-11-13 LAB — TSH: TSH: 1.68 u[IU]/mL (ref 0.450–4.500)

## 2017-11-14 ENCOUNTER — Telehealth: Payer: Self-pay | Admitting: *Deleted

## 2017-11-14 DIAGNOSIS — R7989 Other specified abnormal findings of blood chemistry: Secondary | ICD-10-CM

## 2017-11-14 NOTE — Telephone Encounter (Signed)
LMOVM for pt to return call 

## 2017-11-14 NOTE — Telephone Encounter (Signed)
Patient was notified of results. Expressed understanding. Labs ordered. 

## 2017-11-14 NOTE — Telephone Encounter (Signed)
-----   Message from Malva Limesonald E Fisher, MD sent at 11/13/2017  8:06 PM EDT ----- Testosterone levels are very low. Need to recheck total and free testosterone and Prolactin level to see if he needs supplement testosterone.

## 2017-11-14 NOTE — Addendum Note (Signed)
Addended by: Marlene LardMILLER, [place] Subia M on: 11/14/2017 03:44 PM   Modules accepted: Orders

## 2017-11-16 ENCOUNTER — Other Ambulatory Visit: Payer: Self-pay | Admitting: Family Medicine

## 2017-11-18 LAB — PROLACTIN: Prolactin: 8.9 ng/mL (ref 4.0–15.2)

## 2017-11-18 LAB — TESTOSTERONE,FREE AND TOTAL
Testosterone, Free: 10 pg/mL (ref 8.7–25.1)
Testosterone: 264 ng/dL (ref 264–916)

## 2017-11-21 ENCOUNTER — Other Ambulatory Visit: Payer: Self-pay | Admitting: Family Medicine

## 2017-11-21 DIAGNOSIS — E291 Testicular hypofunction: Secondary | ICD-10-CM | POA: Insufficient documentation

## 2017-11-21 MED ORDER — TESTOSTERONE 20.25 MG/ACT (1.62%) TD GEL: TRANSDERMAL | 1 refills | Status: DC

## 2017-11-23 ENCOUNTER — Telehealth: Payer: Self-pay | Admitting: *Deleted

## 2017-11-23 NOTE — Telephone Encounter (Signed)
Per pharmacy androgel will require a PA. [facility] is faxing over the form.

## 2017-11-23 NOTE — Telephone Encounter (Signed)
Patient was notified of results. Expressed understanding. Rx sent to pharmacy. 

## 2017-11-23 NOTE — Telephone Encounter (Signed)
Received PA form

## 2017-11-23 NOTE — Telephone Encounter (Signed)
-----   Message from Malva Limesonald E Fisher, MD sent at 11/20/2017  9:43 PM EDT ----- Follow up testosterone level is bordeline low. Recommend trial of androgel 1.62% apply 2 pumps daily, 75 grams, rf x 2. Follow up o.v. 1 month after starting medication.

## 2017-11-25 NOTE — Telephone Encounter (Signed)
PA was approved. 

## 2017-11-28 ENCOUNTER — Other Ambulatory Visit: Payer: Self-pay | Admitting: Family Medicine

## 2017-11-28 DIAGNOSIS — F419 Anxiety disorder, unspecified: Secondary | ICD-10-CM

## 2018-05-25 ENCOUNTER — Emergency Department
Admission: EM | Admit: 2018-05-25 | Discharge: 2018-05-25 | Disposition: A | Discharge status: Home / Self Care | Payer: Managed Care, Other (non HMO) | Attending: Emergency Medicine | Admitting: Emergency Medicine

## 2018-05-25 ENCOUNTER — Other Ambulatory Visit: Payer: Self-pay

## 2018-05-25 DIAGNOSIS — K0889 Other specified disorders of teeth and supporting structures: Secondary | ICD-10-CM | POA: Diagnosis present

## 2018-05-25 DIAGNOSIS — Z79899 Other long term (current) drug therapy: Secondary | ICD-10-CM | POA: Insufficient documentation

## 2018-05-25 DIAGNOSIS — K029 Dental caries, unspecified: Secondary | ICD-10-CM | POA: Insufficient documentation

## 2018-05-25 MED ORDER — OXYCODONE-ACETAMINOPHEN 7.5-325 MG PO TABS: 1.0000 | ORAL_TABLET | Freq: Four times a day (QID) | ORAL | 0 refills | Status: DC | PRN

## 2018-05-25 MED ORDER — OXYCODONE-ACETAMINOPHEN 5-325 MG PO TABS
1.0000 | ORAL_TABLET | ORAL | Status: DC | PRN
  Administered 2018-05-25: 1 via ORAL
  Filled 2018-05-25: qty 1

## 2018-05-25 NOTE — ED Provider Notes (Signed)
[facility] Emergency Department Provider Note   ____________________________________________   First MD Initiated Contact with Patient 05/25/18 [phone]     (approximate)  I have reviewed the triage vital signs and the nursing notes.   HISTORY  Chief Complaint Dental Pain    HPI Brian Kirk is a 37 y.o. male patient presents with left lower dental pain for 1 week.  Patient has history of grinding teeth and his filler has come out.  Patient has a dental appointment is already taking antibiotics and ibuprofen.  Patient rated his pain as a 10/10.  Patient described pain is "achy".  No other palliative measures for complaint.  History reviewed. No pertinent past medical history.  Patient Active Problem List   Diagnosis Date Noted  . Hypogonadism in male 11/21/2017  . Excessive daytime sleepiness 11/11/2017  . Low back pain 12/17/2014  . Allergic rhinitis 12/06/2014  . Anxiety 12/06/2014  . Depression 12/06/2014  . Blood pressure elevated 12/06/2014  . Chromophytosis 12/06/2014    Past Surgical History:  Procedure Laterality Date  . KNEE SURGERY  Age 37 or [age]   Orthoscopic  . LUNG SURGERY     Lungs collapsed as a child    Prior to Admission medications   Medication Sig Start Date End Date Taking? Authorizing Provider  oxyCODONE-acetaminophen (PERCOCET) 7.5-325 MG tablet Take 1 tablet by mouth every 6 (six) hours as needed for severe pain. 05/25/18   Joni ReiningSmith, Oseas Detty K, PA-C  sertraline (ZOLOFT) 100 MG tablet TAKE 1 TABLET DAILY 11/28/17   Malva LimesFisher, Donald E, MD  Testosterone 20.25 MG/ACT (1.62%) GEL 2 pumps applied to skin daily 11/21/17   Malva LimesFisher, Donald E, MD  topiramate (TOPAMAX) 25 MG tablet 1 at bedtime for 1 week, then 1 twice a day for 1week, then one QAM and 2 QPM 11/11/17   Malva LimesFisher, Donald E, MD    Allergies Tomato flavor  [flavoring agent]  Family History  Problem Relation Age of Onset  . Breast cancer Mother   . Hypertension Father   .  Kidney disease Father        HAD TRANSPLANT  . Testicular cancer Father   . Kidney disease Sister     Social History Social History   Tobacco Use  . Smoking status: Never Smoker  . Smokeless tobacco: Never Used  Substance Use Topics  . Alcohol use: Yes    Alcohol/week: 0.0 standard drinks    Comment: rare  . Drug use: No    Review of Systems  Constitutional: No fever/chills Eyes: No visual changes. ENT: No sore throat.  Dental pain Cardiovascular: Denies chest pain. Respiratory: Denies shortness of breath. Gastrointestinal: No abdominal pain.  No nausea, no vomiting.  No diarrhea.  No constipation. Genitourinary: Negative for dysuria. Musculoskeletal: Negative for back pain. Skin: Negative for rash. Neurological: Negative for headaches, focal weakness or numbness. Allergic/Immunilogical: Tomatoes ____________________________________________   PHYSICAL EXAM:  VITAL SIGNS: ED Triage Vitals  Enc Vitals Group     BP 05/25/18 0444 (!) 168/97     Pulse Rate 05/25/18 0444 61     Resp --      Temp 05/25/18 0444 98 F (36.7 C)     Temp Source 05/25/18 0444 Oral     SpO2 05/25/18 0444 99 %     Weight 05/25/18 0442 214 lb (97.1 kg)     Height 05/25/18 0442 5\' 11"  (1.803 m)     Head Circumference --      Peak Flow --  Pain Score 05/25/18 0442 10     Pain Loc --      Pain Edu? --      Excl. in GC? --    Constitutional: Alert and oriented. Well appearing and in no acute distress. Mouth/Throat: Mucous membranes are moist.  Oropharynx non-erythematous.  Dental carry teeth #20. Cardiovascular: Normal rate, regular rhythm. Grossly normal heart sounds.  Good peripheral circulation. Respiratory: Normal respiratory effort.  No retractions. Lungs CTAB. Skin:  Skin is warm, dry and intact. No rash noted. Psychiatric: Mood and affect are normal. Speech and behavior are normal.  ____________________________________________   LABS (all labs ordered are listed, but only  abnormal results are displayed)  Labs Reviewed - No data to display ____________________________________________  EKG   ____________________________________________  RADIOLOGY  ED MD interpretation:    Official radiology report(s): No results found.  ____________________________________________   PROCEDURES  Procedure(s) performed: None  Procedures  Critical Care performed: No  ____________________________________________   INITIAL IMPRESSION / ASSESSMENT AND PLAN / ED COURSE  As part of my medical decision making, I reviewed the following data within the electronic MEDICAL RECORD NUMBER    Tenths pain secondary to caries.  Patient given discharge care instruction.  Patient advised take medication as directed follow-up with scheduled dental appointment.      ____________________________________________   FINAL CLINICAL IMPRESSION(S) / ED DIAGNOSES  Final diagnoses:  Pain due to dental caries     ED Discharge Orders         Ordered    oxyCODONE-acetaminophen (PERCOCET) 7.5-325 MG tablet  Every 6 hours PRN,   Status:  Discontinued     05/25/18 0704    oxyCODONE-acetaminophen (PERCOCET) 7.5-325 MG tablet  Every 6 hours PRN     05/25/18 [id number]           Note:  This document was prepared using Dragon voice recognition software and may include unintentional dictation errors.    Joni ReiningSmith, Mila Pair K, PA-C 05/25/18 [id number]    Emily FilbertWilliams, Jonathan E, MD 05/25/18 [phone]

## 2018-05-25 NOTE — ED Notes (Signed)
See triage note  Presents with a 1 week hx of dental pain  Hx of dental caries  Denies any recent trauma

## 2018-05-25 NOTE — ED Triage Notes (Signed)
Pt arrives to ED via POV from home with c/o left-sided lower dental pain x1 week. Pt states she "grinds his teeth and had a filing come out". No c/o fever, no obvious facial swelling. Pt reports taking Tylenol and Ibuprofen without relief. Pt also reports being started on Amoxicillin "24 hrs ago" by his dentist to clear up a possible infection.

## 2018-06-23 DIAGNOSIS — J018 Other acute sinusitis: Secondary | ICD-10-CM | POA: Diagnosis not present

## 2018-08-09 ENCOUNTER — Encounter: Payer: Self-pay | Admitting: Family Medicine

## 2018-08-09 ENCOUNTER — Ambulatory Visit (INDEPENDENT_AMBULATORY_CARE_PROVIDER_SITE_OTHER): Payer: BLUE CROSS/BLUE SHIELD | Admitting: Family Medicine

## 2018-08-09 VITALS — BP 122/72 | HR 76 | Temp 98.2°F | Resp 16 | Wt 215.0 lb

## 2018-08-09 DIAGNOSIS — F900 Attention-deficit hyperactivity disorder, predominantly inattentive type: Secondary | ICD-10-CM

## 2018-08-09 DIAGNOSIS — E291 Testicular hypofunction: Secondary | ICD-10-CM

## 2018-08-09 DIAGNOSIS — F419 Anxiety disorder, unspecified: Secondary | ICD-10-CM

## 2018-08-09 MED ORDER — TESTOSTERONE 4 MG/24HR TD PT24: 1.0000 | MEDICATED_PATCH | Freq: Every day | TRANSDERMAL | 1 refills | Status: DC

## 2018-08-09 MED ORDER — AMPHETAMINE-DEXTROAMPHET ER 10 MG PO CP24
10.0000 mg | ORAL_CAPSULE | Freq: Every day | ORAL | 0 refills | Status: DC
Start: 2018-08-09 — End: 2018-09-18

## 2018-08-09 NOTE — Patient Instructions (Addendum)
. Please review the attached list of medications and notify my office if there are any errors.   . Please bring all of your medications to every appointment so we can make sure that our medication list is the same as yours.    Amphetamine; Dextroamphetamine extended-release capsules What is this medicine? AMPHETAMINE; DEXTROAMPHETAMINE (am FET a meen; dex troe am FET a meen) is used to treat attention-deficit hyperactivity disorder (ADHD). Federal law prohibits giving this medicine to any person other than the person for whom it was prescribed. Do not share this medicine with anyone else. This medicine may be used for other purposes; ask your health care provider or pharmacist if you have questions. COMMON BRAND NAME(S): Adderall XR, Mydayis What should I tell my health care provider before I take this medicine? They need to know if you have any of these conditions: -anxiety or panic attacks -circulation problems in fingers and toes -glaucoma -hardening or blockages of the arteries or heart blood vessels -heart disease or a heart defect -high blood pressure -history of a drug or alcohol abuse problem -history of stroke -kidney disease -liver disease -mental illness -seizures -suicidal thoughts, plans, or attempt; a previous suicide attempt by you or a family member -thyroid disease -Tourette's syndrome -an unusual or allergic reaction to dextroamphetamine, other amphetamines, other medicines, foods, dyes, or preservatives -pregnant or trying to get pregnant -breast-feeding How should I use this medicine? Take this medicine by mouth with a glass of water. Follow the directions on the prescription label. This medicine is taken just one time per day, usually in the morning after waking up. Take with or without food. Do not chew or crush this medicine. You may open the capsules and sprinkle the medicine on a spoonful of applesauce. If sprinkled on applesauce, take the dose immediately and  do not crush or chew. Do not take your medicine more often than directed. A special MedGuide will be given to you by the pharmacist with each prescription and refill. Be sure to read this information carefully each time. Talk to your pediatrician regarding the use of this medicine in children. While this drug may be prescribed for children as young as 6 years for selected conditions, precautions do apply. Some extended-release capsules are recommended for use only in children 42 years of age and older. Overdosage: If you think you have taken too much of this medicine contact a poison control center or emergency room at once. NOTE: This medicine is only for you. Do not share this medicine with others. What if I miss a dose? If you miss a dose, take it as soon as you can in the morning, but do not take it later in the day because it can cause trouble sleeping. If it is almost time for your next dose, take only that dose. Do not take double or extra doses. What may interact with this medicine? Do not take this medicine with any of the following medications: -MAOIs like Carbex, Eldepryl, Marplan, Nardil, and Parnate -other stimulant medicines for attention disorders This medicine may also interact with the following medications: -acetazolamide -alcohol -ammonium chloride -antacids -ascorbic acid -atomoxetine -caffeine -certain medicines for blood pressure -certain medicines for depression, anxiety, or psychotic disturbances -certain medicines for seizures like carbamazepine, phenobarbital, phenytoin -certain medicines for stomach problems like cimetidine, ranitidine, famotidine, esomeprazole, omeprazole, lansoprazole, pantoprazole -lithium -medicines for colds and breathing difficulties -medicines for diabetes -medicines or dietary supplements for weight loss or to stay awake -methenamine -narcotic medicines for pain -  quinidine -ritonavir -sodium bicarbonate -St. John's wort This list  may not describe all possible interactions. Give your health care provider a list of all the medicines, herbs, non-prescription drugs, or dietary supplements you use. Also tell them if you smoke, drink alcohol, or use illegal drugs. Some items may interact with your medicine. What should I watch for while using this medicine? Visit your doctor or health care professional for regular checks on your progress. This prescription requires that you follow special procedures with your doctor and pharmacy. You will need to have a new written prescription from your doctor every time you need a refill. This medicine may affect your concentration, or hide signs of tiredness. Until you know how this medicine affects you, do not drive, ride a bicycle, use machinery, or do anything that needs mental alertness. Alcohol should be avoided with some brands of this medicine. Talk to your doctor or health care professional if you have questions. Tell your doctor or health care professional if this medicine loses its effects, or if you feel you need to take more than the prescribed amount. Do not change the dosage without talking to your doctor or health care professional. Decreased appetite is a common side effect when starting this medicine. Eating small, frequent meals or snacks can help. Talk to your doctor if you continue to have poor eating habits. Height and weight growth of a child taking this medicine will be monitored closely. Do not take this medicine close to bedtime. It may prevent you from sleeping. If you are going to need surgery, an MRI, a CT scan, or other procedure, tell your doctor that you are taking this medicine. You may need to stop taking this medicine before the procedure. Tell your doctor or healthcare professional right away if you notice unexplained wounds on your fingers and toes while taking this medicine. You should also tell your healthcare provider if you experience numbness or pain, changes in  the skin color, or sensitivity to temperature in your fingers or toes. What side effects may I notice from receiving this medicine? Side effects that you should report to your doctor or health care professional as soon as possible: -allergic reactions like skin rash, itching or hives, swelling of the face, lips, or tongue -anxious -breathing problems -changes in emotions or moods -changes in vision -chest pain or chest tightness -fast, irregular heartbeat -fingers or toes feel numb, cool, painful -hallucination, loss of contact with reality -high blood pressure -males: prolonged or painful erection -seizures -signs and symptoms of serotonin syndrome like confusion, increased sweating, fever, tremor, stiff muscles, diarrhea -signs and symptoms of a stroke like changes in vision; confusion; trouble speaking or understanding; severe headaches; sudden numbness or weakness of the face, arm or leg; trouble walking; dizziness; loss of balance or coordination -suicidal thoughts or other mood changes -uncontrollable head, mouth, neck, arm, or leg movements Side effects that usually do not require medical attention (report to your doctor or health care professional if they continue or are bothersome): -dry mouth -headache -irritability -loss of appetite -nausea -trouble sleeping -weight loss This list may not describe all possible side effects. Call your doctor for medical advice about side effects. You may report side effects to FDA at 1-800-FDA-1088. Where should I keep my medicine? Keep out of the reach of children. This medicine can be abused. Keep your medicine in a safe place to protect it from theft. Do not share this medicine with anyone. Selling or giving away this medicine is  dangerous and against the law. Store at room temperature between 15 and 30 degrees C (59 and 86 degrees F). Keep container tightly closed. Protect from light. Throw away any unused medicine after the expiration  date. NOTE: This sheet is a summary. It may not cover all possible information. If you have questions about this medicine, talk to your doctor, pharmacist, or health care provider.  2019 Elsevier/Gold Standard (2016-07-11 13:37:27)

## 2018-08-09 NOTE — Progress Notes (Signed)
   08/09/18 0847  Part A  1. How often do you have trouble wrapping up the final details of a project, once the challenging parts have been done? (!) Very Often  2. How often do you have difficulty getting things done in order when you have to do a task that requires organization? (!) Sometimes  3. How often do you have problems remembering appointments or obligations? (!) Often  4. When you have a task that requires a lot of thought, how often do you avoid or delay getting started? (!) Often  5. How often do you fidget or squirm with your hands or feet when you have to sit down for a long time? (!) Very Often  6. How often do you feel overly active and compelled to do things, like you were driven by a motor? (!) Often

## 2018-08-09 NOTE — Progress Notes (Addendum)
Patient: Brian Kirk Male    DOB: 09/01/1980   38 y.o.   MRN: [id number] Visit Date: 08/09/2018  Today's Provider: Mila Merry, MD   Chief Complaint  Patient presents with  . ADHD    Pt would like to be evaluated for ADD   Subjective:     States he has always had difficulty focusing and completing tasks. Has been worse the last few years with distractions of young children. Affects him at home and at work. Caused him to miss a business flight a few weeks ago. His wife is concerned and he reports she made appointment for him to be seen to get on ADD medications. He does have history of anxiety on sertraline, but is only taking 1/2 tablet a day. Has a little anxiety now, but no depression. Does have trouble winding down and going to sleep at night due to racing thoughts.  He does have history of hypotestosteronism and was prescribed androgel last year but never filled because he didn't want children to be exposed to testosterone. He does feel fatigued with very low energy and would like to try another formulation of testosterone.  He continues to have headaches a couple of times every week. Was prescribed topiramate last year, but now states he didn't realize prescription had been sent in for him.       Allergies  Allergen Reactions  . Tomato Flavor  [Flavoring Agent] Rash     Current Outpatient Medications:  .  sertraline (ZOLOFT) 100 MG tablet, TAKE 1 TABLET DAILY (Patient taking differently: 50 mg. ), Disp: 90 tablet, Rfl: 4 .  Testosterone 20.25 MG/ACT (1.62%) GEL, 2 pumps applied to skin daily (Patient not taking: Reported on 08/09/2018), Disp: 75 g, Rfl: 1 .  topiramate (TOPAMAX) 25 MG tablet, 1 at bedtime for 1 week, then 1 twice a day for 1week, then one QAM and 2 QPM (Patient not taking: Reported on 08/09/2018), Disp: 60 tablet, Rfl: 0  Review of Systems  Constitutional: Positive for fatigue. Negative for activity change, appetite change, chills, diaphoresis, fever  and unexpected weight change.  Respiratory: Negative.   Cardiovascular: Negative.   Neurological: Positive for headaches (Pt has a history of migraines.  He states they are stable.). Negative for dizziness and light-headedness.  Psychiatric/Behavioral: Positive for decreased concentration. Negative for agitation, behavioral problems, confusion, dysphoric mood, hallucinations, self-injury, sleep disturbance and suicidal ideas. The patient is not nervous/anxious and is not hyperactive.     Social History   Tobacco Use  . Smoking status: Never Smoker  . Smokeless tobacco: Never Used  Substance Use Topics  . Alcohol use: Yes    Alcohol/week: 0.0 standard drinks    Comment: rare      Objective:   BP 122/72 (BP Location: Right Arm, Patient Position: Sitting, Cuff Size: Large)   Pulse 76   Temp 98.2 F (36.8 C) (Oral)   Resp 16   Wt 215 lb (97.5 kg)   BMI 29.99 kg/m  Vitals:   08/09/18 0815  BP: 122/72  Pulse: 76  Resp: 16  Temp: 98.2 F (36.8 C)  TempSrc: Oral  Weight: 215 lb (97.5 kg)     Physical Exam  General appearance: alert, well developed, well nourished, cooperative and in no distress, obese Head: Normocephalic, without obvious abnormality, atraumatic Respiratory: Respirations even and unlabored, normal respiratory rate Extremities: No gross deformities Skin: Skin color, texture, turgor normal. No rashes seen  Psych: Appropriate mood and affect. Neurologic: Mental  status: Alert, oriented to person, place, and time, thought content appropriate.  ASRS v1.1 screener completed. See other note.     Assessment & Plan    1. Attention deficit hyperactivity disorder (ADHD), predominantly inattentive type Counseled that Adderall is a stimulant and a schedule II medication. He understands potential adverse effects and not to combine with other stimulants or illicit drugs. He is agreeable to periodic drug screening.  - amphetamine-dextroamphetamine (ADDERALL XR) 10 MG 24  hr capsule; Take 1 capsule (10 mg total) by mouth daily.  Dispense: 30 capsule; Refill: 0  2. Anxiety Tolerating sertraline, but only taking 50mg . Advised he can continue taking with Adderall and may want to go back up to 100mg  tablet as he does have some underlying anxiety.   3. Hypogonadism in male Never started gel. He is willing to try patch, possibly injection if any trouble filling prescription for patch.   - testosterone (ANDRODERM) 4 MG/24HR PT24 patch; Place 1 patch onto the skin daily.  Dispense: 30 patch; Refill: 1     Mila Merry, MD  [facility]

## 2018-08-14 ENCOUNTER — Telehealth: Payer: Self-pay | Admitting: Family Medicine

## 2018-08-14 DIAGNOSIS — R7989 Other specified abnormal findings of blood chemistry: Secondary | ICD-10-CM

## 2018-08-14 NOTE — Telephone Encounter (Signed)
Please advise, patient's insurance requires we recheck free and total testosterone. The most recent test from 11/16/2017 was not low enough to cover medication. Please print and leave order at lab for patient.

## 2018-08-14 NOTE — Telephone Encounter (Signed)
Advised 

## 2018-08-14 NOTE — Telephone Encounter (Signed)
Tried calling patient. Left message to call back. 

## 2018-09-07 ENCOUNTER — Telehealth: Payer: Self-pay

## 2018-09-07 ENCOUNTER — Ambulatory Visit: Payer: Managed Care, Other (non HMO) | Admitting: Family Medicine

## 2018-09-07 NOTE — Telephone Encounter (Signed)
Left message for patient call and reschedule appointment.

## 2018-09-12 ENCOUNTER — Ambulatory Visit (INDEPENDENT_AMBULATORY_CARE_PROVIDER_SITE_OTHER): Payer: BLUE CROSS/BLUE SHIELD | Admitting: Family Medicine

## 2018-09-12 ENCOUNTER — Other Ambulatory Visit: Payer: Self-pay

## 2018-09-12 DIAGNOSIS — F900 Attention-deficit hyperactivity disorder, predominantly inattentive type: Secondary | ICD-10-CM | POA: Insufficient documentation

## 2018-09-12 DIAGNOSIS — F419 Anxiety disorder, unspecified: Secondary | ICD-10-CM | POA: Diagnosis not present

## 2018-09-12 DIAGNOSIS — E291 Testicular hypofunction: Secondary | ICD-10-CM | POA: Diagnosis not present

## 2018-09-12 MED ORDER — SERTRALINE HCL 100 MG PO TABS: 100.0000 mg | ORAL_TABLET | Freq: Every day | ORAL | 2 refills | Status: DC

## 2018-09-12 NOTE — Progress Notes (Signed)
Patient: Brian Kirk Male    DOB: [birth year]   38 y.o.   MRN: [id number] Visit Date: 09/12/2018  Today's Provider: Mila Merryonald Fisher, MD   Chief Complaint  Patient presents with  . ADHD    Follow up visit   Subjective:    Virtual Visit via Video Note  I connected with Brian NormanSteven Trompeter on 09/12/18 at 10:20 AM EDT by a video enabled telemedicine application and verified that I am speaking with the correct person using two identifiers.   I discussed the limitations of evaluation and management by telemedicine and the availability of in person appointments. The patient expressed understanding and agreed to proceed.    HPI  Follow up ADD. Was initially evaluated for this on 08/09/2018 and prescribed amphetamine-dextroamphetamine (ADDERALL XR) 10 MG. He states It works well, but found that it makes him irritable if he takes it every day. He now only takes it prn when he expects to require more  focus and attention with his work. Is otherwise tolerating well. Does have slightly decreased appetite when he takes it. No difficulty with sleeping or depression. Is only taking 1/2 tablet sertraline a day.   Allergies  Allergen Reactions  . Tomato Flavor  [Flavoring Agent] Rash     Current Outpatient Medications:  .  amphetamine-dextroamphetamine (ADDERALL XR) 10 MG 24 hr capsule, Take 1 capsule (10 mg total) by mouth daily., Disp: 30 capsule, Rfl: 0 .  sertraline (ZOLOFT) 100 MG tablet, TAKE 1 TABLET DAILY (Patient taking differently: 50 mg. ), Disp: 90 tablet, Rfl: 4 .  testosterone (ANDRODERM) 4 MG/24HR PT24 patch, Place 1 patch onto the skin daily., Disp: 30 patch, Rfl: 1 (NOT TAKING)  Review of Systems  Constitutional: Negative for appetite change, chills and fever.  Respiratory: Negative for chest tightness, shortness of breath and wheezing.   Cardiovascular: Negative for chest pain and palpitations.  Gastrointestinal: Negative for abdominal pain, nausea and vomiting.    Social  History   Tobacco Use  . Smoking status: Never Smoker  . Smokeless tobacco: Never Used  Substance Use Topics  . Alcohol use: Yes    Alcohol/week: 0.0 standard drinks    Comment: rare      Objective:   There were no vitals taken for this visit.    Physical Exam  General appearance: alert, well developed, well nourished, cooperative and in no distress Head: Normocephalic, without obvious abnormality, atraumatic Psych: Appropriate mood and affect. Neurologic: Mental status: Alert, oriented to person, place, and time, thought content appropriate.     Assessment & Plan    1. Attention deficit hyperactivity disorder (ADHD), predominantly inattentive type Generally doing well with prn amphetamine-dextroamphetamine, but does affect mood if he takes every day. Counseled on options including reducing dose, changing to Vyvanse, or going back up to a full tablet of sertraline every day to manage irritability. He prefers to continue current regiment of taking amphetamine-dextroamphetamine prn, but will notify me if he decides to try different regiment.   2. Anxiety refill- sertraline (ZOLOFT) 100 MG tablet; Take 1 tablet (100 mg total) by mouth daily.  Dispense: 90 tablet; Refill: 2 (patient currently taking 1/2 daily  3. Hypogonadism in male He has not yet returned to recheck levels required by insurance before covering testosterone patches.   Future Appointments  Date Time Provider Department Center  12/12/2018  8:40 AM Malva LimesFisher, Donald E, MD [facility] None    Counseled that periodic drug screening will be required due to amphetamine-dextroamphetamine  being schedule 2 medication.     I discussed the assessment and treatment plan with the patient. The patient was provided an opportunity to ask questions and all were answered. The patient agreed with the plan and demonstrated an understanding of the instructions.   The patient was advised to call back or seek an in-person evaluation if the  symptoms worsen or if the condition fails to improve as anticipated.  I provided 15 minutes of non-face-to-face time during this encounter.    Mila Merry, MD  [facility]

## 2018-09-12 NOTE — Patient Instructions (Signed)
.   Please review the attached list of medications and notify my office if there are any errors.   . Please bring all of your medications to every appointment so we can make sure that our medication list is the same as yours.   

## 2018-09-18 ENCOUNTER — Other Ambulatory Visit: Payer: Self-pay | Admitting: Family Medicine

## 2018-09-18 DIAGNOSIS — F900 Attention-deficit hyperactivity disorder, predominantly inattentive type: Secondary | ICD-10-CM

## 2018-09-18 MED ORDER — AMPHETAMINE-DEXTROAMPHET ER 10 MG PO CP24: 10.0000 mg | ORAL_CAPSULE | Freq: Every day | ORAL | 0 refills | Status: DC

## 2018-09-18 NOTE — Telephone Encounter (Signed)
Please Review

## 2018-12-12 ENCOUNTER — Ambulatory Visit: Payer: Self-pay | Admitting: Family Medicine

## 2018-12-17 ENCOUNTER — Encounter: Payer: Self-pay | Admitting: Family Medicine

## 2019-03-20 ENCOUNTER — Ambulatory Visit (INDEPENDENT_AMBULATORY_CARE_PROVIDER_SITE_OTHER): Payer: BC Managed Care – PPO | Admitting: Family Medicine

## 2019-03-20 ENCOUNTER — Encounter: Payer: Self-pay | Admitting: Family Medicine

## 2019-03-20 ENCOUNTER — Other Ambulatory Visit: Payer: Self-pay

## 2019-03-20 VITALS — BP 126/82 | HR 62 | Temp 97.3°F | Resp 16 | Wt 215.0 lb

## 2019-03-20 DIAGNOSIS — M7712 Lateral epicondylitis, left elbow: Secondary | ICD-10-CM | POA: Diagnosis not present

## 2019-03-20 MED ORDER — NAPROXEN 500 MG PO TABS: 500.0000 mg | ORAL_TABLET | Freq: Two times a day (BID) | ORAL | 0 refills | Status: DC

## 2019-03-20 NOTE — Progress Notes (Signed)
       Patient: Brian Kirk Male    DOB: 1980/08/30   38 y.o.   MRN: [id number] Visit Date: 03/20/2019  Today's Provider: [name], MD   Chief Complaint  Patient presents with  . Elbow Pain   Subjective:     HPI  Elbow pain: Patient complains of pain in the left elbow for the past 2 months. Pain radiates from his elbow down to his hand. Patient describes the pain as a sharp pain. He denies any redness or swelling. Tred tylenol and cutting back on working out which helped  Little bit. Does not recall any injuries. Is now feeling weak in forearm and grip strength. Feels like he might drop things. Difficulty to pick up his children.   Allergies  Allergen Reactions  . Tomato Flavor  [Flavoring Agent] Rash     Current Outpatient Medications:  .  amphetamine-dextroamphetamine (ADDERALL XR) 10 MG 24 hr capsule, Take 1 capsule (10 mg total) by mouth daily. (Patient not taking: Reported on 03/20/2019), Disp: 90 capsule, Rfl: 0 .  sertraline (ZOLOFT) 100 MG tablet, Take 1 tablet (100 mg total) by mouth daily. (Patient not taking: Reported on 03/20/2019), Disp: 90 tablet, Rfl: 2  Review of Systems  Constitutional: Negative for appetite change, chills and fever.  Respiratory: Negative for chest tightness, shortness of breath and wheezing.   Cardiovascular: Negative for chest pain and palpitations.  Gastrointestinal: Negative for abdominal pain, nausea and vomiting.  Musculoskeletal: Positive for arthralgias (left elbow pain).    Social History   Tobacco Use  . Smoking status: Never Smoker  . Smokeless tobacco: Never Used  Substance Use Topics  . Alcohol use: Yes    Alcohol/week: 0.0 standard drinks    Comment: rare      Objective:   BP 126/82 (BP Location: Left Arm, Patient Position: Sitting, Cuff Size: Large)   Pulse 62   Temp (!) 97.3 F (36.3 C) (Temporal)   Resp 16   Wt 215 lb (97.5 kg)   SpO2 99% Comment: room air  BMI 29.99 kg/m  Vitals:   03/20/19 1041   BP: 126/82  Pulse: 62  Resp: 16  Temp: (!) 97.3 F (36.3 C)  TempSrc: Temporal  SpO2: 99%  Weight: 215 lb (97.5 kg)  Body mass index is 29.99 kg/m.   Physical Exam  General appearance: Well developed, well nourished male, cooperative and in no acute distress Head: Normocephalic, without obvious abnormality, atraumatic Respiratory: Respirations even and unlabored, normal respiratory rate Extremities: Tender around left lateral epicondyle. Pain reproduced by pronation and hand grip     Assessment & Plan    1. Lateral epicondylitis of left elbow Counseled on conservative treatments verus steroid injection. Patient opted to try conservative therapy as per PI for a few weeks and will return if not significantly improved after 2 weeks.  - naproxen (NAPROSYN) 500 MG tablet; Take 1 tablet (500 mg total) by mouth 2 (two) times daily with a meal.  Dispense: 30 tablet; Refill: 0     [name], MD  [facility]

## 2019-03-20 NOTE — Patient Instructions (Addendum)
. Please review the attached list of medications and notify my office if there are any errors.   . Please bring all of your medications to every appointment so we can make sure that our medication list is the same as yours.   . It is especially important to get the annual flu vaccine this year. If you haven't had it already, please go to your pharmacy or call the office as soon as possible to schedule you flu shot.    Tennis Elbow (lateral epicondylitis)  Tennis elbow is swelling (inflammation) in your outer forearm, near your elbow. Swelling affects the tissues that connect muscle to bone (tendons). Tennis elbow can happen in any sport or job in which you use your elbow too much. It is caused by doing the same motion over and over. Tennis elbow can cause:  Pain and tenderness in your forearm and the outer part of your elbow. You may have pain all the time, or only when using the arm.  A burning feeling. This runs from your elbow through your arm.  Weak grip in your hand. Follow these instructions at home: Activity  Rest your elbow and wrist. Avoid activities that cause problems, as told by your doctor.  If told by your doctor, wear an elbow strap to reduce stress on the area.  Do physical therapy exercises as told.  If you lift an object, lift it with your palm facing up. This is easier on your elbow. Lifestyle  If your tennis elbow is caused by sports, check your equipment and make sure that: ? You are using it correctly. ? It fits you well.  If your tennis elbow is caused by work or by using a computer, take breaks often to stretch your arm. Talk with your manager about how you can manage your condition at work. If you have a brace:  Wear the brace as told by your doctor. Remove it only as told by your doctor.  Loosen the brace if your fingers tingle, get numb, or turn cold and blue.  Keep the brace clean.  If the brace is not waterproof, ask your doctor if you may take  the brace off for bathing. If you must keep the brace on while bathing: ? Do not let it get wet. ? Cover it with a watertight covering when you take a bath or a shower. General instructions   If told, put ice on the painful area: ? Put ice in a plastic bag. ? Place a towel between your skin and the bag. ? Leave the ice on for 20 minutes, 2-3 times a day.  Take over-the-counter and prescription medicines only as told by your doctor.  Keep all follow-up visits as told by your doctor. This is important. Contact a doctor if:  Your pain does not get better with treatment.  Your pain gets worse.  You have weakness in your forearm, hand, or fingers.  You cannot feel your forearm, hand, or fingers. Summary  Tennis elbow is swelling (inflammation) in your outer forearm, near your elbow.  Tennis elbow is caused by doing the same motion over and over.  Rest your elbow and wrist. Avoid activities that cause problems, as told by your doctor.  If told, put ice on the painful area for 20 minutes, 2-3 times a day. This information is not intended to replace advice given to you by your health care provider. Make sure you discuss any questions you have with your health care provider. Document   Released: 11/04/2009 Document Revised: 02/10/2018 Document Reviewed: 03/01/2017 Elsevier Patient Education  2020 [employer] Inc.   

## 2019-04-09 DIAGNOSIS — Z20828 Contact with and (suspected) exposure to other viral communicable diseases: Secondary | ICD-10-CM | POA: Diagnosis not present

## 2019-09-10 DIAGNOSIS — Z20822 Contact with and (suspected) exposure to covid-19: Secondary | ICD-10-CM | POA: Diagnosis not present

## 2019-09-10 DIAGNOSIS — U071 COVID-19: Secondary | ICD-10-CM | POA: Diagnosis not present

## 2019-09-10 DIAGNOSIS — Z03818 Encounter for observation for suspected exposure to other biological agents ruled out: Secondary | ICD-10-CM | POA: Diagnosis not present

## 2019-10-08 ENCOUNTER — Ambulatory Visit (INDEPENDENT_AMBULATORY_CARE_PROVIDER_SITE_OTHER): Payer: BC Managed Care – PPO | Admitting: Podiatry

## 2019-10-08 ENCOUNTER — Other Ambulatory Visit: Payer: Self-pay

## 2019-10-08 ENCOUNTER — Ambulatory Visit (INDEPENDENT_AMBULATORY_CARE_PROVIDER_SITE_OTHER): Payer: BC Managed Care – PPO

## 2019-10-08 ENCOUNTER — Encounter: Payer: Self-pay | Admitting: Podiatry

## 2019-10-08 DIAGNOSIS — M722 Plantar fascial fibromatosis: Secondary | ICD-10-CM

## 2019-10-08 DIAGNOSIS — B353 Tinea pedis: Secondary | ICD-10-CM | POA: Diagnosis not present

## 2019-10-08 MED ORDER — MELOXICAM 15 MG PO TABS: 15.0000 mg | ORAL_TABLET | Freq: Every day | ORAL | 3 refills | Status: DC

## 2019-10-08 MED ORDER — METHYLPREDNISOLONE 4 MG PO TBPK: ORAL_TABLET | ORAL | 0 refills | Status: DC

## 2019-10-08 NOTE — Progress Notes (Signed)
Subjective:  Patient ID: Brian Kirk, male    DOB: [birth year],  MRN: [id number] HPI Chief Complaint  Patient presents with  . Foot Pain    patient presents today for right heel pain x 3-4 months.  He states "its a throbbing pain when I step and it comes and goes.  The pain is worse during the day when walking around and at night"  He has been trying diffrent kinds of shoes and taking Tylenol for relief    39 y.o. male presents with the above complaint.   ROS: Denies fever chills nausea vomiting muscle aches pains calf pain back pain chest pain shortness of breath.  No past medical history on file. Past Surgical History:  Procedure Laterality Date  . KNEE SURGERY  Age 57 or 53   Orthoscopic  . LUNG SURGERY     Lungs collapsed as a child    Current Outpatient Medications:  .  amphetamine-dextroamphetamine (ADDERALL XR) 10 MG 24 hr capsule, Take 1 capsule (10 mg total) by mouth daily. (Patient not taking: Reported on 03/20/2019), Disp: 90 capsule, Rfl: 0 .  meloxicam (MOBIC) 15 MG tablet, Take 1 tablet (15 mg total) by mouth daily., Disp: 30 tablet, Rfl: 3 .  methylPREDNISolone (MEDROL DOSEPAK) 4 MG TBPK tablet, 6 day dose pack - take as directed, Disp: 21 tablet, Rfl: 0 .  naproxen (NAPROSYN) 500 MG tablet, Take 1 tablet (500 mg total) by mouth 2 (two) times daily with a meal., Disp: 30 tablet, Rfl: 0  Allergies  Allergen Reactions  . Oseltamivir Rash    After one single dose of TamiFlu face and neck broke out with rash  . Tomato Flavor  [Flavoring Agent] Rash   Review of Systems Objective:  There were no vitals filed for this visit.  General: Well developed, nourished, in no acute distress, alert and oriented x3   Dermatological: Skin is warm, dry and supple bilateral. Nails x 10 are well maintained; remaining integument appears unremarkable at this time. There are no open sores, no preulcerative lesions, no rash or signs of infection present.  He also has vesicular tinea  pedis bilaterally.  With nail involvement.  Vascular: Dorsalis Pedis artery and Posterior Tibial artery pedal pulses are 2/4 bilateral with immedate capillary fill time. Pedal hair growth present. No varicosities and no lower extremity edema present bilateral.   Neruologic: Grossly intact via light touch bilateral. Vibratory intact via tuning fork bilateral. Protective threshold with Semmes Wienstein monofilament intact to all pedal sites bilateral. Patellar and Achilles deep tendon reflexes 2+ bilateral. No Babinski or clonus noted bilateral.   Musculoskeletal: No gross boney pedal deformities bilateral. No pain, crepitus, or limitation noted with foot and ankle range of motion bilateral. Muscular strength 5/5 in all groups tested bilateral.  Pain to palpation medial calcaneal tubercle of the right heel.  No pain on palpation posterior lateral aspect of the foot  Gait: Unassisted, Nonantalgic.    Radiographs:  Radiographs taken today demonstrate an osseously mature individual with soft tissue increase in density plantar fascial kidney insertion site of the right heel.  Osseously mature individual with no acute findings.  Soft tissue margins appear to be normal.  Assessment & Plan:   Assessment: Plantar fasciitis right tinea pedis with nail involvement.  Plan: Discussed etiology pathology conservative versus surgical therapies.  At this point I injected his right heel today with 20 mg Kenalog 5 mg of Marcaine.  Placed him in a plantar fascial brace and provided him with a  night splint as well.  Started him on methylprednisolone to be followed by meloxicam.  Discussed appropriate shoe gear stretching exercises ice therapy and shoe gear modifications.  We also discussed topical Lamisil AT cream over-the-counter for the athlete's foot.  1 week follow-up with him next we may have to put him on the oral Lamisil and take samples of the toenails to be sent for pathologic evaluation.     Candus Braud T.  [place], [place]

## 2019-10-08 NOTE — Patient Instructions (Signed)

## 2019-11-14 ENCOUNTER — Encounter: Payer: BC Managed Care – PPO | Admitting: Podiatry

## 2020-03-06 DIAGNOSIS — Z20822 Contact with and (suspected) exposure to covid-19: Secondary | ICD-10-CM | POA: Diagnosis not present

## 2020-05-02 ENCOUNTER — Encounter: Payer: Self-pay | Admitting: Family Medicine

## 2020-05-02 ENCOUNTER — Ambulatory Visit (INDEPENDENT_AMBULATORY_CARE_PROVIDER_SITE_OTHER): Payer: BC Managed Care – PPO | Admitting: Family Medicine

## 2020-05-02 ENCOUNTER — Other Ambulatory Visit: Payer: Self-pay

## 2020-05-02 VITALS — BP 137/84 | HR 72 | Temp 98.5°F | Resp 16 | Wt 217.8 lb

## 2020-05-02 DIAGNOSIS — M545 Low back pain, unspecified: Secondary | ICD-10-CM | POA: Diagnosis not present

## 2020-05-02 DIAGNOSIS — G8929 Other chronic pain: Secondary | ICD-10-CM | POA: Diagnosis not present

## 2020-05-02 DIAGNOSIS — F419 Anxiety disorder, unspecified: Secondary | ICD-10-CM

## 2020-05-02 MED ORDER — PREDNISONE 10 MG PO TABS: ORAL_TABLET | ORAL | 0 refills | Status: AC

## 2020-05-02 MED ORDER — SERTRALINE HCL 50 MG PO TABS: ORAL_TABLET | ORAL | 3 refills | Status: DC

## 2020-05-02 NOTE — Progress Notes (Signed)
Established patient visit   Patient: Brian Kirk   DOB: March 16, 1981   [name]  MRN: [id number] Visit Date: 05/02/2020  Today's healthcare provider: [name], MD   Chief Complaint  Patient presents with  . Back Pain   Subjective    Back Pain This is a new problem. Episode onset: 1 month ago. The problem occurs intermittently. The problem has been gradually worsening since onset. The pain is present in the lumbar spine. Pertinent negatives include no abdominal pain, chest pain or fever. He has tried NSAIDs and analgesics for the symptoms. The treatment provided no relief.   Is worse when up and moving around or sitting for long periods of time.   Also has history of anxiety and depression previously on sertraline which he did well with, but has been off of since last year. However he has been having more anxiety lately        Medications: Outpatient Medications Prior to Visit  Medication Sig  . amphetamine-dextroamphetamine (ADDERALL XR) 10 MG 24 hr capsule Take 1 capsule (10 mg total) by mouth daily. (Patient not taking: Reported on 03/20/2019)  . meloxicam (MOBIC) 15 MG tablet Take 1 tablet (15 mg total) by mouth daily. (Patient not taking: Reported on 05/02/2020)  . methylPREDNISolone (MEDROL DOSEPAK) 4 MG TBPK tablet 6 day dose pack - take as directed (Patient not taking: Reported on 05/02/2020)  . naproxen (NAPROSYN) 500 MG tablet Take 1 tablet (500 mg total) by mouth 2 (two) times daily with a meal. (Patient not taking: Reported on 05/02/2020)   No facility-administered medications prior to visit.    Review of Systems  Constitutional: Negative for appetite change, chills and fever.  Respiratory: Negative for chest tightness, shortness of breath and wheezing.   Cardiovascular: Negative for chest pain and palpitations.  Gastrointestinal: Negative for abdominal pain, nausea and vomiting.  Musculoskeletal: Positive for back pain.      Objective    BP 137/84  (BP Location: Left Arm, Patient Position: Sitting, Cuff Size: Large)   Pulse 72   Temp 98.5 F (36.9 C) (Oral)   Resp 16   Wt 217 lb 12.8 oz (98.8 kg)   BMI 30.38 kg/m    Physical Exam  General appearance: Obese male, cooperative and in no acute distress Head: Normocephalic, without obvious abnormality, atraumatic Respiratory: Respirations even and unlabored, normal respiratory rate MS: All extremities are intact. Tender directly over lumbar spine. No Paraspinous tenderness.   Skin: Skin color, texture, turgor normal. No rashes seen  Psych: Appropriate mood and affect. Neurologic: Mental status: Alert, oriented to person, place, and time, thought content appropriate.     Office Visit from 05/02/2020 in [place]  PHQ-9 Total Score 11       Assessment & Plan     1. Chronic midline low back pain without sciatica Has been persistent for at least the last month and getting worse. He is concerned about potential underlying chronic disease which may be affecting his back.  - DG Lumbar Spine Complete; Future - CBC - Comprehensive metabolic panel - Sed Rate (ESR)  He is going to be travelling to [place] for the next 7 days. Will get on - predniSONE (DELTASONE) 10 MG tablet; 6 tablets for 2 days, then 5 for 2 days, then 4 for 2 days, then 3 for 2 days, then 2 for 2 days, then 1 for 2 days.  Dispense: 42 tablet; Refill: 0  2. Anxiety Has been off sertraline  since last year, but having more stress and anxiety lately. Previously on 136m, but will restart - sertraline (ZOLOFT) 50 MG tablet; Take 1/2 tablet daily for 8 days, then increase to 1 tablet daily  Dispense: 30 tablet; Refill: 3        The entirety of the information documented in the History of Present Illness, Review of Systems and Physical Exam were personally obtained by me. Portions of this information were initially documented by the CMA and reviewed by me for thoroughness and accuracy.      DLelon Huh MD  [phone](phone) [phone](fax)  [place]

## 2020-05-02 NOTE — Patient Instructions (Signed)
Go to the [facility] on [address] for LS spine Xray  

## 2020-05-03 LAB — COMPREHENSIVE METABOLIC PANEL
ALT: 34 IU/L (ref 0–44)
AST: 19 IU/L (ref 0–40)
Albumin/Globulin Ratio: 2.2 (ref 1.2–2.2)
Albumin: 5 g/dL (ref 4.0–5.0)
Alkaline Phosphatase: 61 IU/L (ref 44–121)
BUN/Creatinine Ratio: 10 (ref 9–20)
BUN: 12 mg/dL (ref 6–20)
Bilirubin Total: 0.6 mg/dL (ref 0.0–1.2)
CO2: 23 mmol/L (ref 20–29)
Calcium: 10.2 mg/dL (ref 8.7–10.2)
Chloride: 101 mmol/L (ref 96–106)
Creatinine, Ser: 1.22 mg/dL (ref 0.76–1.27)
GFR calc Af Amer: 87 mL/min/{1.73_m2} (ref >59)
GFR calc non Af Amer: 75 mL/min/{1.73_m2} (ref >59)
Globulin, Total: 2.3 g/dL (ref 1.5–4.5)
Glucose: 83 mg/dL (ref 65–99)
Potassium: 4.7 mmol/L (ref 3.5–5.2)
Sodium: 142 mmol/L (ref 134–144)
Total Protein: 7.3 g/dL (ref 6.0–8.5)

## 2020-05-03 LAB — CBC
Hematocrit: 46.2 % (ref 37.5–51.0)
Hemoglobin: 15.8 g/dL (ref 13.0–17.7)
MCH: 28.9 pg (ref 26.6–33.0)
MCHC: 34.2 g/dL (ref 31.5–35.7)
MCV: 85 fL (ref 79–97)
Platelets: 320 10*3/uL (ref 150–450)
RBC: 5.47 x10E6/uL (ref 4.14–5.80)
RDW: 12.4 % (ref 11.6–15.4)
WBC: 7.3 10*3/uL (ref 3.4–10.8)

## 2020-05-03 LAB — SEDIMENTATION RATE: Sed Rate: 2 mm/hr (ref 0–15)

## 2020-05-26 DIAGNOSIS — U071 COVID-19: Secondary | ICD-10-CM | POA: Diagnosis not present

## 2020-05-26 DIAGNOSIS — Z20822 Contact with and (suspected) exposure to covid-19: Secondary | ICD-10-CM | POA: Diagnosis not present

## 2020-05-26 DIAGNOSIS — Z03818 Encounter for observation for suspected exposure to other biological agents ruled out: Secondary | ICD-10-CM | POA: Diagnosis not present

## 2020-10-29 DIAGNOSIS — M25561 Pain in right knee: Secondary | ICD-10-CM | POA: Diagnosis not present

## 2020-11-05 DIAGNOSIS — M25561 Pain in right knee: Secondary | ICD-10-CM | POA: Diagnosis not present

## 2020-11-06 DIAGNOSIS — M25561 Pain in right knee: Secondary | ICD-10-CM | POA: Diagnosis not present

## 2020-11-18 ENCOUNTER — Ambulatory Visit: Payer: BC Managed Care – PPO | Admitting: Family Medicine

## 2020-12-31 ENCOUNTER — Ambulatory Visit (INDEPENDENT_AMBULATORY_CARE_PROVIDER_SITE_OTHER): Payer: BC Managed Care – PPO | Admitting: Family Medicine

## 2020-12-31 ENCOUNTER — Other Ambulatory Visit: Payer: Self-pay

## 2020-12-31 ENCOUNTER — Encounter: Payer: Self-pay | Admitting: Family Medicine

## 2020-12-31 VITALS — BP 135/80 | HR 73 | Ht 72.0 in | Wt 216.0 lb

## 2020-12-31 DIAGNOSIS — Z1159 Encounter for screening for other viral diseases: Secondary | ICD-10-CM

## 2020-12-31 DIAGNOSIS — E781 Pure hyperglyceridemia: Secondary | ICD-10-CM

## 2020-12-31 DIAGNOSIS — R21 Rash and other nonspecific skin eruption: Secondary | ICD-10-CM

## 2020-12-31 DIAGNOSIS — B001 Herpesviral vesicular dermatitis: Secondary | ICD-10-CM | POA: Diagnosis not present

## 2020-12-31 DIAGNOSIS — Z113 Encounter for screening for infections with a predominantly sexual mode of transmission: Secondary | ICD-10-CM | POA: Diagnosis not present

## 2020-12-31 MED ORDER — VALACYCLOVIR HCL 1 G PO TABS: 1000.0000 mg | ORAL_TABLET | Freq: Two times a day (BID) | ORAL | 5 refills | Status: DC

## 2020-12-31 NOTE — Patient Instructions (Signed)
Please review the attached list of medications and notify my office if there are any errors.   Please go to the lab draw station in [address] on the second floor of [facility]. Normal hours are 8:00am to 11:30am and 1:00pm to 4:00pm Monday through Friday  

## 2020-12-31 NOTE — Progress Notes (Signed)
      Established patient visit   Patient: Brian Kirk   DOB: September 24, 1980   40 y.o. Male  MRN: [id number] Visit Date: 12/31/2020  Today's healthcare provider: Mila Merry, MD   Chief Complaint  Patient presents with   Rash   Subjective    This is a new problem. The current episode started in the past 7 days. The problem has been waxing and waning since onset. The affected locations include the scrotum. The rash is characterized by itchiness and burning. He was exposed to nothing. Has been using OTC Abreva. He has history of recurrent oral cold sores and thought the lesions on his scrotum were similar.    Pt would also like blood work for STD screening. Denies any known exposures and denies and penile discharge or burning.   He is also due to check lipids. Lab Results  Component Value Date   CHOL 200 (H) 11/11/2017   HDL 30 (L) 11/11/2017   LDLCALC Comment 11/11/2017   TRIG 424 (H) 11/11/2017   CHOLHDL 6.7 (H) 11/11/2017    He states he had insurance physical about a month ago and cholesterol was high so he has been taking an OTC supplement to try to get it back down.    Medications: Outpatient Medications Prior to Visit  Medication Sig   sertraline (ZOLOFT) 50 MG tablet Take 1/2 tablet daily for 8 days, then increase to 1 tablet daily (Patient not taking: Reported on 12/31/2020)   No facility-administered medications prior to visit.    Review of Systems  Constitutional: Negative.  Negative for appetite change, chills and fever.  Respiratory: Negative.  Negative for chest tightness, shortness of breath and wheezing.   Cardiovascular:  Negative for chest pain and palpitations.  Gastrointestinal:  Negative for abdominal pain, nausea and vomiting.  Skin:  Positive for rash.     Objective    BP 135/80 (BP Location: Right Arm, Patient Position: Sitting, Cuff Size: Large)   Pulse 73   Ht 6' (1.829 m)   Wt 216 lb (98 kg)   SpO2 100%   BMI 29.29 kg/m     Physical Exam   Patient brings photo or rash on scrotum before it cleared showing scattered vesicular lesions over shallow erosions, suggestive of HSV.     Assessment & Plan     1. Scrotal rash Considering location and history of cold sores, this is mostly like due to indirect HSV contact and not sexual contact.   2. Cold sore try valACYclovir (VALTREX) 1000 MG tablet; Take 1 tablet (1,000 mg total) by mouth 2 (two) times daily. Start taking at onset of cold sore or rash  Dispense: 14 tablet; Refill: 5  3. Screen for STD (sexually transmitted disease)  - HIV Antibody (routine testing w rflx) - RPR  4. Hypertriglyceridemia  - Lipid panel  5. Need for hepatitis C screening test  - Hepatitis C antibody        The entirety of the information documented in the History of Present Illness, Review of Systems and Physical Exam were personally obtained by me. Portions of this information were initially documented by the CMA and reviewed by me for thoroughness and accuracy.     Mila Merry, MD  [facility] [phone] (phone) [phone] (fax)  [facility]

## 2021-01-01 LAB — LIPID PANEL
Chol/HDL Ratio: 5 ratio (ref 0.0–5.0)
Cholesterol, Total: 204 mg/dL — ABNORMAL HIGH (ref 100–199)
HDL: 41 mg/dL (ref >39)
LDL Chol Calc (NIH): 140 mg/dL — ABNORMAL HIGH (ref 0–99)
Triglycerides: 126 mg/dL (ref 0–149)
VLDL Cholesterol Cal: 23 mg/dL (ref 5–40)

## 2021-01-01 LAB — HIV ANTIBODY (ROUTINE TESTING W REFLEX): HIV Screen 4th Generation wRfx: NONREACTIVE

## 2021-01-01 LAB — RPR: RPR Ser Ql: NONREACTIVE

## 2021-01-01 LAB — HEPATITIS C ANTIBODY: Hep C Virus Ab: <0.1 s/co ratio (ref 0.0–0.9)

## 2021-01-02 DIAGNOSIS — Z03818 Encounter for observation for suspected exposure to other biological agents ruled out: Secondary | ICD-10-CM | POA: Diagnosis not present

## 2021-01-02 DIAGNOSIS — Z20822 Contact with and (suspected) exposure to covid-19: Secondary | ICD-10-CM | POA: Diagnosis not present

## 2021-05-27 DIAGNOSIS — M79672 Pain in left foot: Secondary | ICD-10-CM | POA: Diagnosis not present

## 2021-05-27 DIAGNOSIS — M722 Plantar fascial fibromatosis: Secondary | ICD-10-CM | POA: Insufficient documentation

## 2021-06-10 DIAGNOSIS — M722 Plantar fascial fibromatosis: Secondary | ICD-10-CM | POA: Diagnosis not present

## 2021-08-05 ENCOUNTER — Ambulatory Visit: Payer: BC Managed Care – PPO

## 2021-08-05 ENCOUNTER — Other Ambulatory Visit: Payer: Self-pay

## 2021-08-05 ENCOUNTER — Ambulatory Visit (INDEPENDENT_AMBULATORY_CARE_PROVIDER_SITE_OTHER): Payer: BC Managed Care – PPO | Admitting: Podiatry

## 2021-08-05 ENCOUNTER — Encounter: Payer: Self-pay | Admitting: Podiatry

## 2021-08-05 DIAGNOSIS — S93692A Other sprain of left foot, initial encounter: Secondary | ICD-10-CM | POA: Diagnosis not present

## 2021-08-06 NOTE — Progress Notes (Signed)
He presents today after having not been seen for a couple of years with a continued history of pain to his left heel.  He states that he has been seen by orthopedics who put him in a boot use anti-inflammatories took x-rays although no avail.  He states that is absolutely no better and orthopedics referred him to our clinic for specialty surgery. ? ?Objective: Vital signs are stable he is alert and oriented x3.  Pulses are palpable.  He has pain on palpation medial and central calcaneal tubercles.  Radiographs were reviewed today from orthopedics which really did not demonstrate any type of osseous abnormalities only soft tissue swelling he did demonstrate a plantar distally oriented calcaneal heel spur without fracture. ? ?Assessment: Chronic heel pain times years left foot.  Probable tear of the plantar fascia. ? ?Plan: At this point after reviewing radiographs and having failed conservative therapies MRI is necessary.  This will be for evaluation differential diagnosis and surgical planning.  Last radiographs was taken 05/27/2021 ?

## 2021-08-19 ENCOUNTER — Other Ambulatory Visit: Payer: Self-pay

## 2021-08-19 ENCOUNTER — Ambulatory Visit
Admission: RE | Admit: 2021-08-19 | Discharge: 2021-08-19 | Disposition: A | Discharge status: Home / Self Care | Payer: BC Managed Care – PPO | Source: Ambulatory Visit | Attending: Podiatry | Admitting: Podiatry

## 2021-08-19 DIAGNOSIS — M7732 Calcaneal spur, left foot: Secondary | ICD-10-CM | POA: Diagnosis not present

## 2021-08-19 DIAGNOSIS — M722 Plantar fascial fibromatosis: Secondary | ICD-10-CM | POA: Diagnosis not present

## 2021-08-19 DIAGNOSIS — R600 Localized edema: Secondary | ICD-10-CM | POA: Diagnosis not present

## 2021-08-19 DIAGNOSIS — S93692A Other sprain of left foot, initial encounter: Secondary | ICD-10-CM

## 2021-08-26 ENCOUNTER — Encounter: Payer: Self-pay | Admitting: Podiatry

## 2021-08-26 ENCOUNTER — Ambulatory Visit (INDEPENDENT_AMBULATORY_CARE_PROVIDER_SITE_OTHER): Payer: BC Managed Care – PPO | Admitting: Podiatry

## 2021-08-26 DIAGNOSIS — S93692D Other sprain of left foot, subsequent encounter: Secondary | ICD-10-CM | POA: Diagnosis not present

## 2021-08-26 DIAGNOSIS — L603 Nail dystrophy: Secondary | ICD-10-CM

## 2021-08-26 NOTE — Progress Notes (Signed)
He presents today for follow-up of his Planter fasciitis of his left foot.  States that is still exquisitely sore and would like to consider surgical intervention.  He is also concerned about the rash on his feet and his toenail thickness and discoloration. ? ?Objective: Vital signs are stable alert and oriented x3.  Pulses are palpable unchanged physical exam on palpation of the plantar fascia.  Though he does demonstrate tinea pedis and some nail plate characteristic changes.  MRI does state no tear of the plantar fascia and of the deep musculature which is inflamed as well.  Bone marrow edema is also a cause of some of the pain. ? ?Assessment: Planter fasciitis bone marrow edema and edema of the flexor digitorum brevis muscle.  Cannot rule out tinea pedis and onychomycosis. ? ?Plan: Discussed etiology pathology conservative versus surgical therapies and took samples of the tissues today to be sent for pathologic evaluation. ? ?We discussed the pros and cons of surgery including physical therapy which she does not want to do.  He would like to go ahead and have surgery and have this fasciitis taking care of.  We consented him today for an endoscopic plantar fasciotomy of the left foot with platelet rich plasma injection.  I answered all the questions regarding this procedure to the best of my ability in layman's terms.  He understands this and is amenable to it.  We did discuss the possible postop complications which may include but are not limited to postop pain bleeding swelling infection recurrence need for further surgery overcorrection under correction loss of digit loss of limb loss of life.  We also discussed the surgery center and the anesthesia and how will be provided.  I will follow-up with him in at least 1 month for the pathology results. ?

## 2021-08-27 DIAGNOSIS — L603 Nail dystrophy: Secondary | ICD-10-CM | POA: Diagnosis not present

## 2021-08-27 DIAGNOSIS — B351 Tinea unguium: Secondary | ICD-10-CM | POA: Diagnosis not present

## 2021-09-23 ENCOUNTER — Encounter: Payer: BC Managed Care – PPO | Admitting: Podiatry

## 2022-05-20 ENCOUNTER — Telehealth: Payer: Self-pay | Admitting: *Deleted

## 2022-05-20 NOTE — Telephone Encounter (Signed)
Please advise 

## 2022-05-20 NOTE — Telephone Encounter (Signed)
Copied from CRM [id number]. Topic: General - Other >> May 20, 2022  2:29 PM Dondra Prader A wrote: Reason for CRM: Patient is requesting to have labs done before physical on 05/28/22. Please advise

## 2022-05-28 ENCOUNTER — Encounter: Payer: BC Managed Care – PPO | Admitting: Family Medicine

## 2022-06-08 DIAGNOSIS — B9689 Other specified bacterial agents as the cause of diseases classified elsewhere: Secondary | ICD-10-CM | POA: Diagnosis not present

## 2022-06-08 DIAGNOSIS — J208 Acute bronchitis due to other specified organisms: Secondary | ICD-10-CM | POA: Diagnosis not present

## 2022-06-08 DIAGNOSIS — R059 Cough, unspecified: Secondary | ICD-10-CM | POA: Diagnosis not present

## 2022-06-09 ENCOUNTER — Ambulatory Visit: Payer: Self-pay

## 2022-06-29 ENCOUNTER — Encounter: Payer: Self-pay | Admitting: Family Medicine

## 2022-06-30 ENCOUNTER — Other Ambulatory Visit: Payer: Self-pay

## 2022-06-30 DIAGNOSIS — Z0000 Encounter for general adult medical examination without abnormal findings: Secondary | ICD-10-CM

## 2022-06-30 NOTE — Telephone Encounter (Signed)
Pt sent a mychart message and asked when rescheduling cpe if he can have lab orders placed to get his labs done before cpe appt to discuss at the appt / pt was not contacted back and is asking today if he can get those lab orders asap / his CPE appt is Friday / please advise

## 2022-07-02 ENCOUNTER — Encounter: Payer: Self-pay | Admitting: Family Medicine

## 2022-07-02 ENCOUNTER — Ambulatory Visit (INDEPENDENT_AMBULATORY_CARE_PROVIDER_SITE_OTHER): Payer: BC Managed Care – PPO | Admitting: Family Medicine

## 2022-07-02 VITALS — BP 138/102 | Temp 97.7°F | Ht 72.0 in | Wt 210.0 lb

## 2022-07-02 DIAGNOSIS — Z0000 Encounter for general adult medical examination without abnormal findings: Secondary | ICD-10-CM | POA: Diagnosis not present

## 2022-07-02 DIAGNOSIS — R030 Elevated blood-pressure reading, without diagnosis of hypertension: Secondary | ICD-10-CM | POA: Diagnosis not present

## 2022-07-02 MED ORDER — AMLODIPINE BESYLATE 5 MG PO TABS: 5.0000 mg | ORAL_TABLET | Freq: Every day | ORAL | 0 refills | Status: DC

## 2022-07-02 NOTE — Progress Notes (Unsigned)
Complete physical exam   Patient: Brian Kirk   DOB: September 25, 1980   42 y.o. Male  MRN: [id number] Visit Date: 07/02/2022  Today's healthcare provider: [name], MD   Chief Complaint  Patient presents with   Annual Exam   Hypertension   Subjective    Brian Kirk is a 42 y.o. male who presents today for a complete physical exam. He generally feels well. Does feel more fatigue and achy with age. Has some anxiety and a little trouble sleeping. He states his wife checks his blood pressure at home and is consistently in the 130s/90s. No chest pains or dyspnea. Occasionally palpitations or rapid heart rate when anxious. He does drink at least a 24 oz Yeti of coffee most days and occasional caffeinated soft drink.   No past medical history on file. Past Surgical History:  Procedure Laterality Date   KNEE SURGERY  Age 13 or 28   Orthoscopic   LUNG SURGERY     Lungs collapsed as a child   Social History   Socioeconomic History   Marital status: Married    Spouse name: Not on file   Number of children: Not on file   Years of education: Not on file   Highest education level: Not on file  Occupational History   Occupation: Network Security    Comment: [employer]  Tobacco Use   Smoking status: Never   Smokeless tobacco: Never  Substance and Sexual Activity   Alcohol use: Yes    Alcohol/week: 0.0 standard drinks of alcohol    Comment: rare   Drug use: No   Sexual activity: Not on file  Other Topics Concern   Not on file  Social History Narrative   Not on file   Social Determinants of Health   Financial Resource Strain: Not on file  Food Insecurity: Not on file  Transportation Needs: Not on file  Physical Activity: Not on file  Stress: Not on file  Social Connections: Not on file  Intimate Partner Violence: Not on file   Family Status  Relation Name Status   Mother  Deceased       died during surgery   Father  Alive   Sister Twin Sister Alive   Family  History  Problem Relation Age of Onset   Breast cancer Mother    Hypertension Father    Kidney disease Father        HAD TRANSPLANT   Testicular cancer Father    Kidney disease Sister    Allergies  Allergen Reactions   Oseltamivir Rash    After one single dose of TamiFlu face and neck broke out with rash   Tomato Flavor  [Flavoring Agent] Rash    Patient Care Team: Birdie Sons, MD as PCP - General (Family Medicine)   Medications: No outpatient medications prior to visit.   No facility-administered medications prior to visit.    Review of Systems  Constitutional:  Negative for chills, diaphoresis and fever.  HENT:  Negative for congestion, ear discharge, ear pain, hearing loss, nosebleeds, sore throat and tinnitus.   Eyes:  Negative for photophobia, pain, discharge and redness.  Respiratory:  Negative for cough, shortness of breath, wheezing and stridor.   Cardiovascular:  Negative for chest pain, palpitations and leg swelling.  Gastrointestinal:  Negative for abdominal pain, blood in stool, constipation, diarrhea, nausea and vomiting.  Endocrine: Negative for polydipsia.  Genitourinary:  Negative for dysuria, flank pain, frequency, hematuria and urgency.  Musculoskeletal:  Negative for back pain, myalgias and neck pain.  Skin:  Negative for rash.  Allergic/Immunologic: Negative for environmental allergies.  Neurological:  Negative for dizziness, tremors, seizures, weakness and headaches.  Hematological:  Does not bruise/bleed easily.  Psychiatric/Behavioral:  Negative for hallucinations and suicidal ideas. The patient is not nervous/anxious.     {Labs  Heme  Chem  Endocrine  Serology  Results Review (optional):23779}  Objective    BP (!) 138/102 (BP Location: Left Arm, Patient Position: Sitting, Cuff Size: Large)   Temp 97.7 F (36.5 C)   Ht 6' (1.829 m)   Wt 210 lb (95.3 kg)   SpO2 98%   BMI 28.48 kg/m  {Show previous vital signs  (optional):23777}   Physical Exam   General Appearance:    Well developed, well nourished male. Alert, cooperative, in no acute distress, appears stated age  Head:    Normocephalic, without obvious abnormality, atraumatic  Eyes:    PERRL, conjunctiva/corneas clear, EOM's intact, fundi    benign, both eyes       Ears:    Normal TM's and external ear canals, both ears  Nose:   Nares normal, septum midline, mucosa normal, no drainage   or sinus tenderness  Throat:   Lips, mucosa, and tongue normal; teeth and gums normal  Neck:   Supple, symmetrical, trachea midline, no adenopathy;       thyroid:  No enlargement/tenderness/nodules; no carotid   bruit or JVD  Back:     Symmetric, no curvature, ROM normal, no CVA tenderness  Lungs:     Clear to auscultation bilaterally, respirations unlabored  Chest wall:    No tenderness or deformity  Heart:    Normal heart rate. Normal rhythm. No murmurs, rubs, or gallops.  S1 and S2 normal  Abdomen:     Soft, non-tender, bowel sounds active all four quadrants,    no masses, no organomegaly  Genitalia:    deferred  Rectal:    deferred  Extremities:   All extremities are intact. No cyanosis or edema  Pulses:   2+ and symmetric all extremities  Skin:   Skin color, texture, turgor normal, no rashes or lesions  Lymph nodes:   Cervical, supraclavicular, and axillary nodes normal  Neurologic:   CNII-XII intact. Normal strength, sensation and reflexes      throughout   EKG - NSR, no acute problems  Last depression screening scores    07/02/2022    2:02 PM 12/31/2020    8:56 AM 05/02/2020   10:51 AM  PHQ 2/9 Scores  PHQ - 2 Score 0 0 3  PHQ- 9 Score 0 2 11   Last fall risk screening    07/02/2022    2:02 PM  [place] in the past year? 0  Number falls in past yr: 0  Injury with Fall? 0   Last Audit-C alcohol use screening    12/31/2020    8:57 AM  Alcohol Use Disorder Test (AUDIT)  1. How often do you have a drink containing alcohol? 2   2. How many drinks containing alcohol do you have on a typical day when you are drinking? 0  3. How often do you have six or more drinks on one occasion? 0  AUDIT-C Score 2   A score of 3 or more in women, and 4 or more in men indicates increased risk for alcohol abuse, EXCEPT if all of the points are from question 1   No results  found for any visits on 07/02/22.  Assessment & Plan    Routine Health Maintenance and Physical Exam  Exercise Activities and Dietary recommendations  Goals   None     Immunization History  Administered Date(s) Administered   Influenza,inj,Quad PF,6+ Mos 04/14/2018   Influenza-Unspecified 03/14/2017   Tdap 01/26/2013    Health Maintenance  Topic Date Due   COVID-19 Vaccine (1) Never done   INFLUENZA VACCINE  08/29/2022 (Originally 12/29/2021)   DTaP/Tdap/Td (2 - Td or Tdap) 01/27/2023   Hepatitis C Screening  Completed   HIV Screening  Completed   HPV VACCINES  Aged Out    Discussed health benefits of physical activity, and encouraged him to engage in regular exercise appropriate for his age and condition.  He declined recommended flu vaccine  2. Elevated blood pressure reading  - EKG 12-Lead  Recommend limiting caffeine intake. Encourage regular exercise. Likely some anxiety component.   Start amLODipine (NORVASC) 5 MG tablet; Take 1 tablet (5 mg total) by mouth daily.  Dispense: 90 tablet; Refill: 0   Return in about 6 weeks (around 08/13/2022) for BLOOD PRESSURE.        [name], MD  [place] [phone] (phone) [phone] (fax)  [place]

## 2022-07-02 NOTE — Patient Instructions (Signed)
.   Please review the attached list of medications and notify my office if there are any errors.   . Please bring all of your medications to every appointment so we can make sure that our medication list is the same as yours.   

## 2022-07-03 LAB — CBC WITH DIFFERENTIAL/PLATELET
Basophils Absolute: 0 10*3/uL (ref 0.0–0.2)
Basos: 0 %
EOS (ABSOLUTE): 0.1 10*3/uL (ref 0.0–0.4)
Eos: 2 %
Hematocrit: 47.3 % (ref 37.5–51.0)
Hemoglobin: 15.9 g/dL (ref 13.0–17.7)
Immature Grans (Abs): 0 10*3/uL (ref 0.0–0.1)
Immature Granulocytes: 1 %
Lymphocytes Absolute: 2.3 10*3/uL (ref 0.7–3.1)
Lymphs: 36 %
MCH: 28.9 pg (ref 26.6–33.0)
MCHC: 33.6 g/dL (ref 31.5–35.7)
MCV: 86 fL (ref 79–97)
Monocytes Absolute: 0.5 10*3/uL (ref 0.1–0.9)
Monocytes: 8 %
Neutrophils Absolute: 3.3 10*3/uL (ref 1.4–7.0)
Neutrophils: 53 %
Platelets: 351 10*3/uL (ref 150–450)
RBC: 5.51 x10E6/uL (ref 4.14–5.80)
RDW: 12.6 % (ref 11.6–15.4)
WBC: 6.2 10*3/uL (ref 3.4–10.8)

## 2022-07-03 LAB — LIPID PANEL
Chol/HDL Ratio: 4.7 ratio (ref 0.0–5.0)
Cholesterol, Total: 174 mg/dL (ref 100–199)
HDL: 37 mg/dL — ABNORMAL LOW (ref >39)
LDL Chol Calc (NIH): 116 mg/dL — ABNORMAL HIGH (ref 0–99)
Triglycerides: 118 mg/dL (ref 0–149)
VLDL Cholesterol Cal: 21 mg/dL (ref 5–40)

## 2022-07-03 LAB — COMPREHENSIVE METABOLIC PANEL
ALT: 29 IU/L (ref 0–44)
AST: 20 IU/L (ref 0–40)
Albumin/Globulin Ratio: 2.3 — ABNORMAL HIGH (ref 1.2–2.2)
Albumin: 5 g/dL (ref 4.1–5.1)
Alkaline Phosphatase: 58 IU/L (ref 44–121)
BUN/Creatinine Ratio: 9 (ref 9–20)
BUN: 12 mg/dL (ref 6–20)
Bilirubin Total: 0.6 mg/dL (ref 0.0–1.2)
CO2: 24 mmol/L (ref 20–29)
Calcium: 10.6 mg/dL — ABNORMAL HIGH (ref 8.7–10.2)
Chloride: 100 mmol/L (ref 96–106)
Creatinine, Ser: 1.33 mg/dL — ABNORMAL HIGH (ref 0.76–1.27)
Globulin, Total: 2.2 g/dL (ref 1.5–4.5)
Glucose: 108 mg/dL — ABNORMAL HIGH (ref 70–99)
Potassium: 5.3 mmol/L — ABNORMAL HIGH (ref 3.5–5.2)
Sodium: 140 mmol/L (ref 134–144)
Total Protein: 7.2 g/dL (ref 6.0–8.5)
eGFR: 70 mL/min/{1.73_m2} (ref >59)

## 2022-07-03 LAB — TSH: TSH: 0.942 u[IU]/mL (ref 0.450–4.500)

## 2022-07-03 LAB — HEMOGLOBIN A1C
Est. average glucose Bld gHb Est-mCnc: 100 mg/dL
Hgb A1c MFr Bld: 5.1 % (ref 4.8–5.6)

## 2022-08-12 NOTE — Progress Notes (Signed)
I,[name],acting as a Education administrator for Brian Huh, MD.,have documented all relevant documentation on the behalf of Brian Huh, MD,as directed by  Brian Huh, MD while in the presence of Brian Huh, MD.   Established patient visit   Patient: Brian Kirk   DOB: 12/01/1980   42 y.o. Male  MRN: [id number] Visit Date: 08/13/2022  Today's healthcare provider: [name], MD    Subjective    HPI  Elevated Blood Pressure, follow-up  BP Readings from Last 3 Encounters:  07/02/22 (!) 138/102  12/31/20 135/80  05/02/20 137/84   Wt Readings from Last 3 Encounters:  07/02/22 210 lb (95.3 kg)  12/31/20 216 lb (98 kg)  05/02/20 217 lb 12.8 oz (98.8 kg)     He was last seen for hypertension 6 weeks ago.  BP at that visit was 138/102. Management since that visit includes Start amLODipine (NORVASC) 5 MG tablet .  He reports excellent compliance with treatment. He  is not sure if he is  having side effects. reports mid-day he notices his ears get really hot  Outside blood pressures are not being checked. . Symptoms: No chest pain No chest pressure  No palpitations No syncope  No dyspnea No orthopnea  No paroxysmal nocturnal dyspnea No lower extremity edema   Pertinent labs Lab Results  Component Value Date   CHOL 174 07/02/2022   HDL 37 (L) 07/02/2022   LDLCALC 116 (H) 07/02/2022   TRIG 118 07/02/2022   CHOLHDL 4.7 07/02/2022   Lab Results  Component Value Date   NA 140 07/02/2022   K 5.3 (H) 07/02/2022   CREATININE 1.33 (H) 07/02/2022   EGFR 70 07/02/2022   GLUCOSE 108 (H) 07/02/2022   TSH 0.942 07/02/2022     The ASCVD Risk score (Arnett DK, et al., 2019) failed to calculate for the following reasons:   The 2019 ASCVD risk score is only valid for ages 79 to 43  ---------------------------------------------------------------------------------------------------   Medications: Outpatient Medications Prior to Visit  Medication Sig   meloxicam  (MOBIC) 15 MG tablet Take 15 mg by mouth daily.   amLODipine (NORVASC) 5 MG tablet Take 1 tablet (5 mg total) by mouth daily.   No facility-administered medications prior to visit.        Objective    BP (!) 140/95 (BP Location: Left Arm, Patient Position: Sitting, Cuff Size: Large)   Pulse 74   Ht 6' (1.829 m)   Wt 207 lb 6.4 oz (94.1 kg)   SpO2 100%   BMI 28.13 kg/m    Physical Exam  General appearance: Well developed, well nourished male, cooperative and in no acute distress Head: Normocephalic, without obvious abnormality, atraumatic Respiratory: Respirations even and unlabored, normal respiratory rate Extremities: All extremities are intact.  Skin: Skin color, texture, turgor normal. No rashes seen  Psych: Appropriate mood and affect. Neurologic: Mental status: Alert, oriented to person, place, and time, thought content appropriate.   Assessment & Plan    1. Primary hypertension Tolerating initiation of amlodipine with minimal improvement in blood pressure. He is going to work on reducing salty foods in diet and increasing exercise.   Increase from 5mg  to amLODipine (NORVASC) 10 MG tablet; Take 1 tablet (10 mg total) by mouth daily.  Dispense: 90 tablet; Refill: 1  2. Pain of left heel Was prescribed meloxicam by podiatry which he takes intermittently and needs refill for. Counseled this could increase blood pressure and try to minimize use. Refilled 15mg  today.  3. Chronic left shoulder pain He anticipates getting in with orthopedics for evaluation.   Future Appointments  Date Time Provider [place]  11/08/2022  8:00 AM Caryn Section, Kirstie Peri, MD [facility]        The entirety of the information documented in the History of Present Illness, Review of Systems and Physical Exam were personally obtained by me. Portions of this information were initially documented by the CMA and reviewed by me for thoroughness and accuracy.     Brian Huh, MD  [place] [phone] (phone) [phone] (fax)  [place]

## 2022-08-13 ENCOUNTER — Ambulatory Visit (INDEPENDENT_AMBULATORY_CARE_PROVIDER_SITE_OTHER): Payer: BC Managed Care – PPO | Admitting: Family Medicine

## 2022-08-13 ENCOUNTER — Encounter: Payer: Self-pay | Admitting: Family Medicine

## 2022-08-13 VITALS — BP 140/95 | HR 74 | Ht 72.0 in | Wt 207.4 lb

## 2022-08-13 DIAGNOSIS — M79672 Pain in left foot: Secondary | ICD-10-CM | POA: Diagnosis not present

## 2022-08-13 DIAGNOSIS — G8929 Other chronic pain: Secondary | ICD-10-CM

## 2022-08-13 DIAGNOSIS — M25512 Pain in left shoulder: Secondary | ICD-10-CM

## 2022-08-13 DIAGNOSIS — I10 Essential (primary) hypertension: Secondary | ICD-10-CM | POA: Diagnosis not present

## 2022-08-13 MED ORDER — AMLODIPINE BESYLATE 10 MG PO TABS: 10.0000 mg | ORAL_TABLET | Freq: Every day | ORAL | 1 refills | Status: DC

## 2022-08-13 MED ORDER — MELOXICAM 15 MG PO TABS: 15.0000 mg | ORAL_TABLET | Freq: Every day | ORAL | 1 refills | Status: AC | PRN

## 2022-08-13 NOTE — Patient Instructions (Signed)
.   Please review the attached list of medications and notify my office if there are any errors.   . Please bring all of your medications to every appointment so we can make sure that our medication list is the same as yours.   

## 2022-08-18 IMAGING — MR MR ANKLE*L* W/O CM
4 of 5 series · 13 of 40 positions shown · non-contrast
Comparison: None.

CLINICAL DATA: Left ankle pain, heel pain

EXAM:
MRI OF THE LEFT ANKLE WITHOUT CONTRAST
TECHNIQUE: Multiplanar, multisequence MR imaging of the ankle was performed. No
intravenous contrast was administered.

[Series 3: T2 fat-sat · axial · left · 3.0mm · 0.25mm/px · z∈[-77,+35]mm · 4 of 33 slices shown (1 of 2)]
[im 1/33]
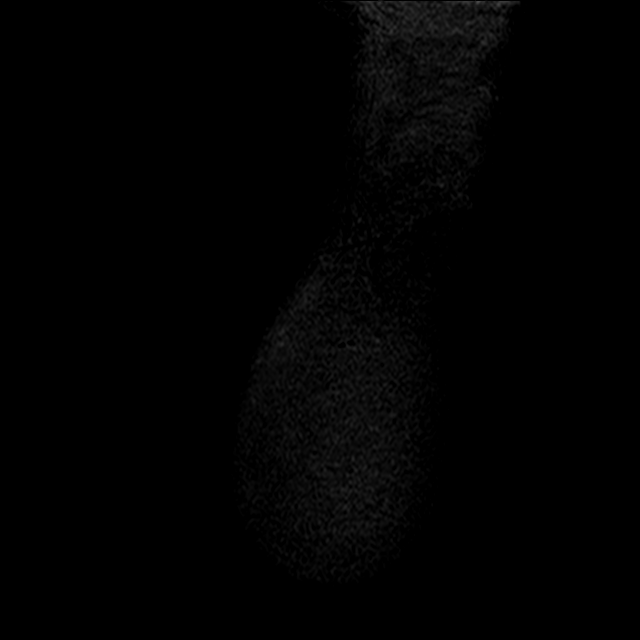
[im 4/33]
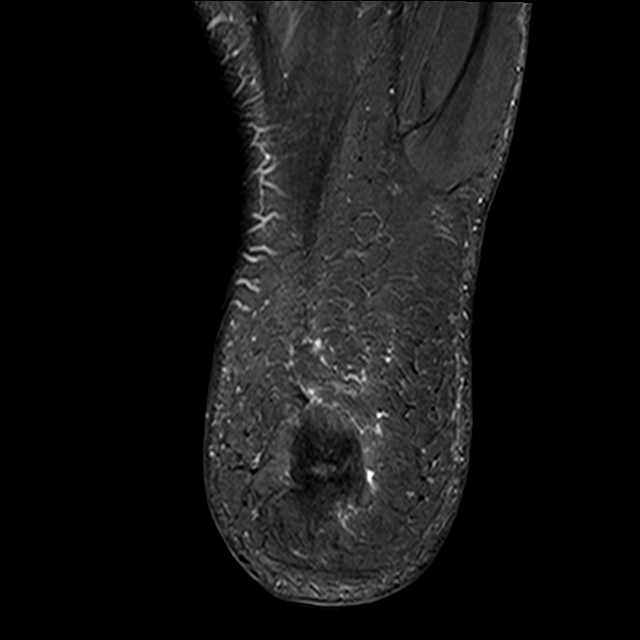
[im 18/33]
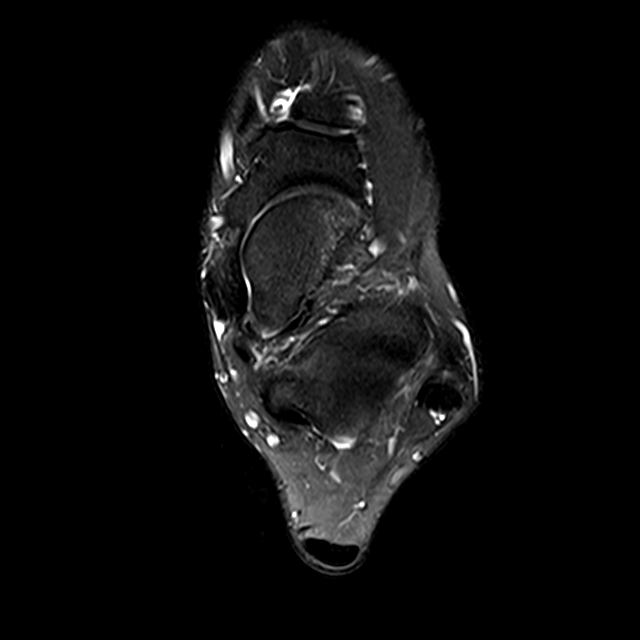
[im 29/33]
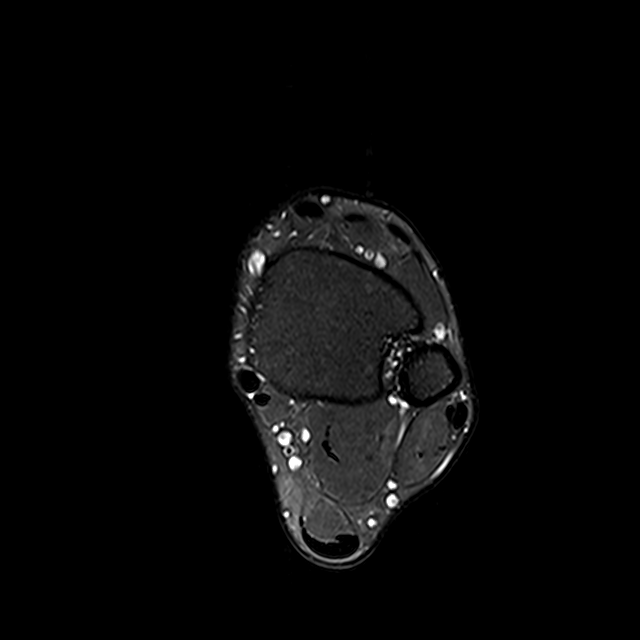

[Series 4: T1 · axial · left · 3.0mm · 0.25mm/px · z∈[-65,+35]mm · 3 of 33 slices shown (1 of 2)]
[im 4/33]
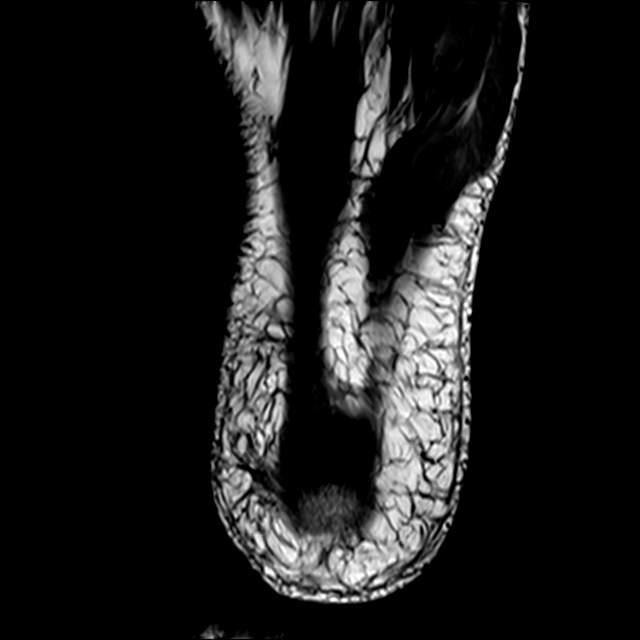
[im 18/33]
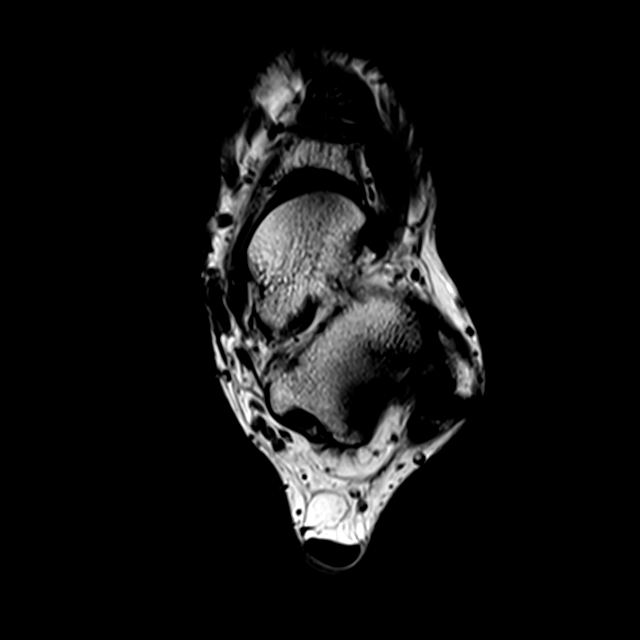
[im 29/33]
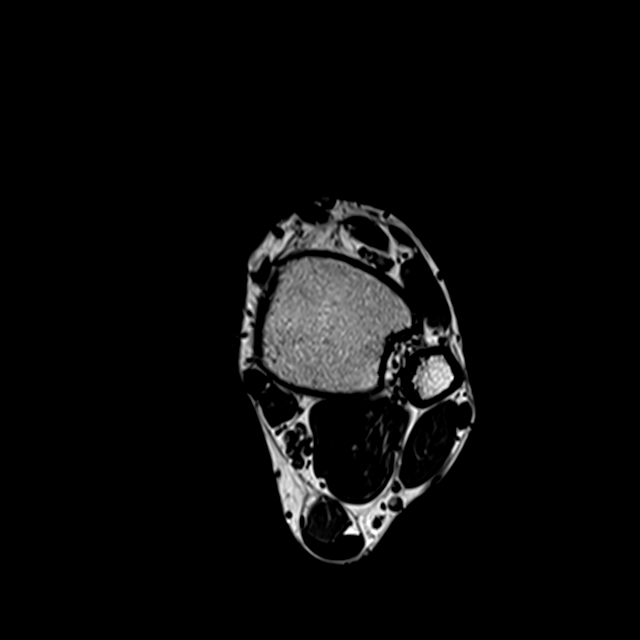

[Series 5: T1 · sagittal · left · 4.0mm · 0.27mm/px · 3 of 18 slices shown (2 of 2)]
[im 1/18]
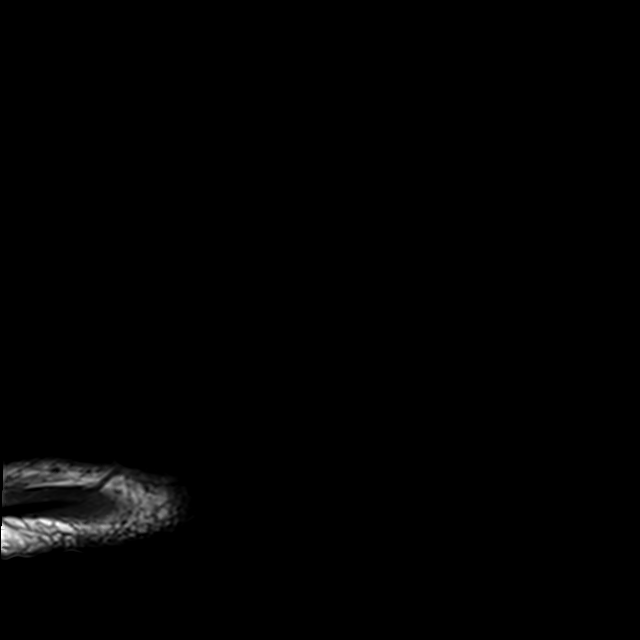
[im 9/18]
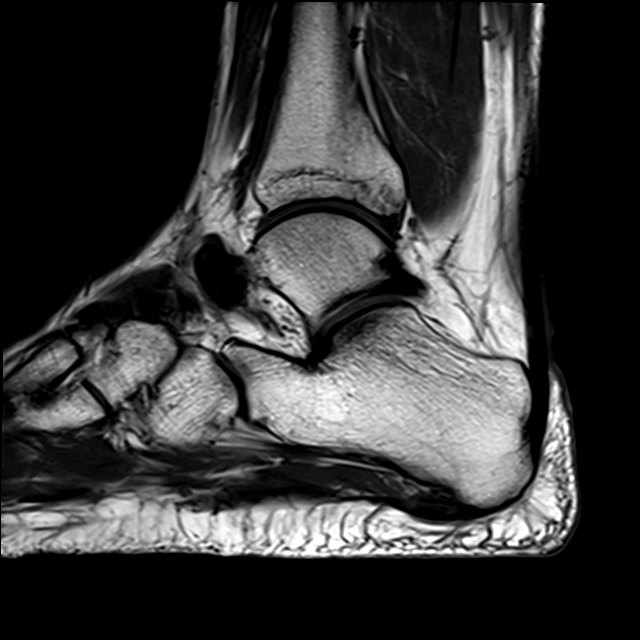
[im 18/18]
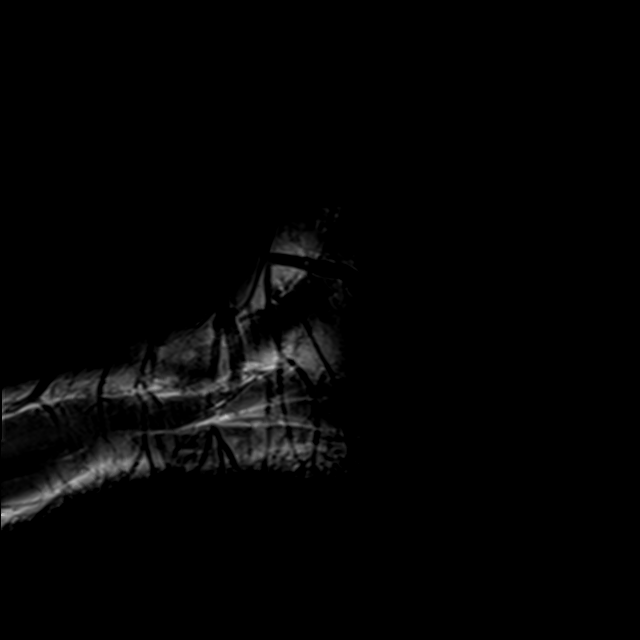

[Series 7: T2 fat-sat · coronal · left · 3.0mm · 0.25mm/px · 3 of 35 slices shown (2 of 2)]
[im 4/35]
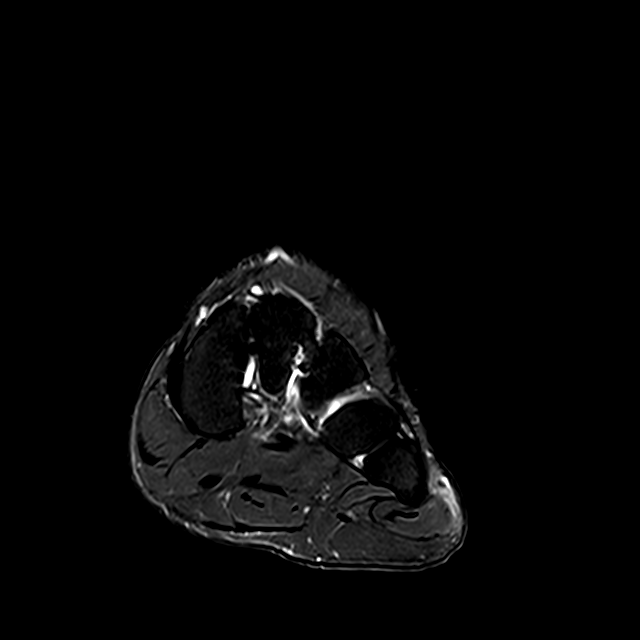
[im 19/35]
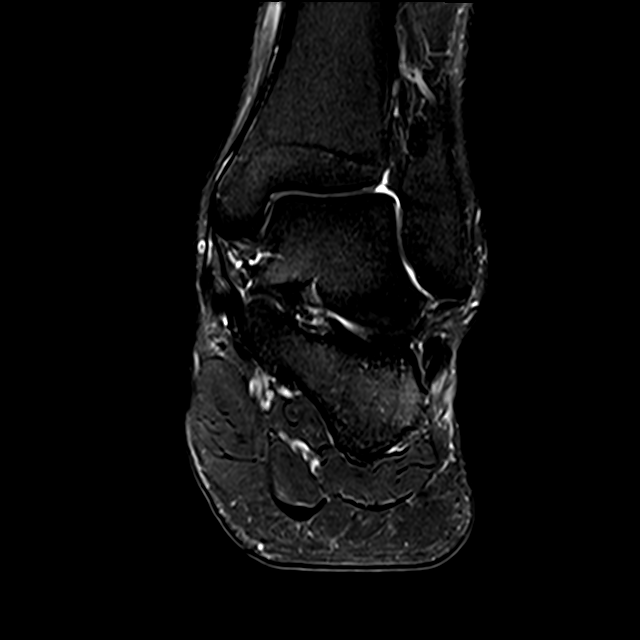
[im 31/35]
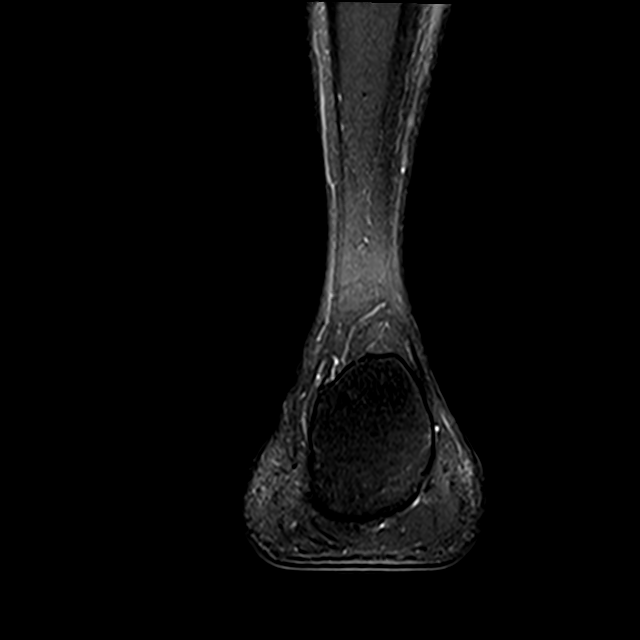

[13 of 40 positions shown; findings below may reference images not displayed]

FINDINGS: TENDONS

Peroneal: Moderate tendinosis of the peroneus longus with
subcortical reactive marrow edema in lateral calcaneus at the
peroneal tubercle. Peroneal brevis intact.

Posteromedial: Posterior tibial tendon intact. Flexor hallucis
longus tendon intact. Flexor digitorum longus tendon intact.

Anterior: Tibialis anterior tendon intact. Extensor hallucis longus
tendon intact Extensor digitorum longus tendon intact.

Achilles:  Intact.

Plantar Fascia: Mild thickening and increased signal of the medial
band of the plantar fascia at the calcaneal insertion with
subcortical reactive bone marrow edema consistent with plantar
fasciitis. No tear of the plantar fascia. Small plantar calcaneal
spur. Mild edema in the flexor digitorum brevis muscle deep to the
plantar fascia.

LIGAMENTS

Lateral: Anterior talofibular ligament intact. Calcaneofibular
ligament intact. Posterior talofibular ligament intact. Anterior and
posterior tibiofibular ligaments intact.

Medial: Deltoid ligament intact. Spring ligament intact.

CARTILAGE

Ankle Joint: No joint effusion. Normal ankle mortise. No chondral
defect.

Subtalar Joints/Sinus Tarsi: Normal subtalar joints. No subtalar
joint effusion. Normal sinus tarsi.

Bones: No aggressive osseous lesion. No fracture or dislocation.
Mild osteoarthritis of the talonavicular joint

Soft Tissue: No fluid collection or hematoma. Muscles are normal
without edema or atrophy. Tarsal tunnel is normal.
IMPRESSION: 1. Plantar fasciitis of the medial band of the plantar fascia with
subcortical reactive bone marrow edema and without a tear. Mild
edema in the flexor digitorum brevis muscle deep to the plantar
fascia which may be reactive versus secondary to muscle strain.
2. Moderate tendinosis of the peroneus longus with subcortical
reactive marrow edema in the lateral calcaneus at the peroneal
tubercle.

## 2022-09-28 ENCOUNTER — Other Ambulatory Visit: Payer: Self-pay | Admitting: Family Medicine

## 2022-11-08 ENCOUNTER — Ambulatory Visit: Payer: BC Managed Care – PPO | Admitting: Family Medicine

## 2023-02-06 ENCOUNTER — Other Ambulatory Visit: Payer: Self-pay | Admitting: Family Medicine

## 2023-02-06 DIAGNOSIS — I10 Essential (primary) hypertension: Secondary | ICD-10-CM

## 2023-06-08 ENCOUNTER — Other Ambulatory Visit: Payer: Self-pay | Admitting: Family Medicine

## 2023-06-08 DIAGNOSIS — I10 Essential (primary) hypertension: Secondary | ICD-10-CM

## 2023-06-08 NOTE — Telephone Encounter (Signed)
 Medication Refill -  Most Recent Primary Care Visit:  Provider: GASPER NANCYANN BRAVO  Department: [facility]  Visit Type: OFFICE VISIT  Date: 08/13/2022  Medication: amLODipine  (NORVASC ) 10 MG tablet   Has the patient contacted their pharmacy? No   Is this the correct pharmacy for this prescription? Yes If no, delete pharmacy and type the correct one.  This is the patient's preferred pharmacy:  [id number] [address] [facility] - [address] [place] [address] Phone: [phone] Fax: [phone]   Has the prescription been filled recently? No  Is the patient out of the medication? Yes he has been without his medication since Saturday  Has the patient been seen for an appointment in the last year OR does the patient have an upcoming appointment? Yes  Can we respond through MyChart? Yes    Please assist patient further

## 2023-06-10 ENCOUNTER — Telehealth: Payer: Self-pay | Admitting: Family Medicine

## 2023-06-10 MED ORDER — AMLODIPINE BESYLATE 10 MG PO TABS: 10.0000 mg | ORAL_TABLET | Freq: Every day | ORAL | 0 refills | Status: DC

## 2023-06-10 NOTE — Telephone Encounter (Signed)
 Appointment 06/13/23- courtesy 30 day Rx given Requested Prescriptions  Pending Prescriptions Disp Refills   amLODipine  (NORVASC ) 10 MG tablet 90 tablet 1    Sig: Take 1 tablet (10 mg total) by mouth daily.     Cardiovascular: Calcium Channel Blockers 2 Failed - 06/10/2023  8:58 AM      Failed - Last BP in normal range    BP Readings from Last 1 Encounters:  08/13/22 (!) 140/95         Failed - Valid encounter within last 6 months    Recent Outpatient Visits           10 months ago Primary hypertension   [place] [facility] Gasper Nancyann BRAVO, MD   11 months ago Annual physical exam   [facility] Gasper Nancyann BRAVO, MD   2 years ago Scrotal rash   [place] [facility] Gasper Nancyann BRAVO, MD   3 years ago Chronic midline low back pain without sciatica   [facility] Gasper Nancyann BRAVO, MD   4 years ago Lateral epicondylitis of left elbow   [place] [facility] Gasper Nancyann BRAVO, MD       Future Appointments             In 3 days Gasper, Nancyann BRAVO, MD [facility]            Passed - Last Heart Rate in normal range    Pulse Readings from Last 1 Encounters:  08/13/22 74

## 2023-06-10 NOTE — Telephone Encounter (Signed)
 Medication Refill -  Most Recent Primary Care Visit:  Provider: GASPER NANCYANN BRAVO  Department: [facility]  Visit Type: OFFICE VISIT  Date: 08/13/2022  Medication:  amLODipine  (NORVASC ) 10 MG tablet    Has the patient contacted their pharmacy? No  Is this the correct pharmacy for this prescription? Yes  This is the patient's preferred pharmacy:  [id number] [address] [facility] - [address] [place] [address] Phone: [phone] Fax: [phone]   Has the prescription been filled recently? Yes  Is the patient out of the medication? Yes  Has the patient been seen for an appointment in the last year OR does the patient have an upcoming appointment? Yes  Can we respond through MyChart? No  Agent: Please be advised that Rx refills may take up to 3 business days. We ask that you follow-up with your pharmacy.  Patient says he just needs this refill to take him to his next appt

## 2023-06-10 NOTE — Telephone Encounter (Signed)
 Duplicate request- Courtesy refill has been granted

## 2023-06-13 ENCOUNTER — Telehealth (INDEPENDENT_AMBULATORY_CARE_PROVIDER_SITE_OTHER): Payer: 59 | Admitting: Family Medicine

## 2023-06-13 DIAGNOSIS — I10 Essential (primary) hypertension: Secondary | ICD-10-CM

## 2023-06-13 DIAGNOSIS — R300 Dysuria: Secondary | ICD-10-CM

## 2023-06-13 NOTE — Progress Notes (Signed)
    MyChart Video Visit    Virtual Visit via Video Note   This format is felt to be most appropriate for this patient at this time. Physical exam was limited by quality of the video and audio technology used for the visit.   Patient location: home Provider location: [facility]  I discussed the limitations of evaluation and management by telemedicine and the availability of in person appointments. The patient expressed understanding and agreed to proceed.  Patient: Brian Kirk   DOB: Jun 23, 1980   43 y.o. Male  MRN: [id number] Visit Date: 06/13/2023  Today's healthcare provider: Nancyann Perry, MD    Subjective    HPI  For follow up hyperlipidemia. Feels well taking amlodipine . He feels much worse if he misses medication. Has also been having flank pain and dysuria. He is going to be travelling out of the country next week and concerned he may have a urinary tract infection.   Lab Results  Component Value Date   NA 140 07/02/2022   K 5.3 (H) 07/02/2022   CREATININE 1.33 (H) 07/02/2022   EGFR 70 07/02/2022   GLUCOSE 108 (H) 07/02/2022   Lab Results  Component Value Date   CHOL 174 07/02/2022   HDL 37 (L) 07/02/2022   LDLCALC 116 (H) 07/02/2022   TRIG 118 07/02/2022   CHOLHDL 4.7 07/02/2022   Lab Results  Component Value Date   TSH 0.942 07/02/2022     Medications: Outpatient Medications Prior to Visit  Medication Sig   amLODipine  (NORVASC ) 10 MG tablet Take 1 tablet (10 mg total) by mouth daily.   meloxicam  (MOBIC ) 15 MG tablet Take 1 tablet (15 mg total) by mouth daily as needed for pain.   No facility-administered medications prior to visit.   Review of Systems     Objective    There were no vitals taken for this visit.   Physical Exam  Awake, alert, oriented x 3. In no apparent distress   Assessment & Plan     1. Dysuria (Primary) He is to come into office to provide urine specimen for u/a in the morning of 1/15.   2. Primary hypertension Doing well  with current dose of amlodipine . Advised he needs to schedule in office appointment sometime in the next 1-2 months for BP check and labs. Continue same dose of amlodipine  until then .     I discussed the assessment and treatment plan with the patient. The patient was provided an opportunity to ask questions and all were answered. The patient agreed with the plan and demonstrated an understanding of the instructions.   The patient was advised to call back or seek an in-person evaluation if the symptoms worsen or if the condition fails to improve as anticipated.  I provided 11 minutes of non-face-to-face time during this encounter.   Nancyann Perry, MD [facility] [phone] (phone) [phone] (fax)  [facility]

## 2023-06-15 LAB — POCT URINALYSIS DIPSTICK
Bilirubin, UA: NEGATIVE
Blood, UA: NEGATIVE
Glucose, UA: NEGATIVE
Ketones, UA: NEGATIVE
Leukocytes, UA: NEGATIVE
Nitrite, UA: NEGATIVE
Protein, UA: NEGATIVE
Spec Grav, UA: 1.02 (ref 1.010–1.025)
Urobilinogen, UA: 0.2 U/dL
pH, UA: 6 (ref 5.0–8.0)

## 2023-06-15 NOTE — Addendum Note (Signed)
 Addended by: KATHI SQUIRES D on: 06/15/2023 09:20 AM   Modules accepted: Orders

## 2023-06-17 LAB — URINE CULTURE

## 2023-06-17 LAB — SPECIMEN STATUS REPORT

## 2023-07-02 ENCOUNTER — Other Ambulatory Visit: Payer: Self-pay | Admitting: Family Medicine

## 2023-07-02 DIAGNOSIS — I10 Essential (primary) hypertension: Secondary | ICD-10-CM

## 2023-08-10 ENCOUNTER — Ambulatory Visit: Payer: BC Managed Care – PPO | Admitting: Family Medicine

## 2023-08-10 ENCOUNTER — Encounter: Payer: Self-pay | Admitting: Family Medicine

## 2023-08-10 VITALS — BP 125/84 | HR 73 | Temp 98.1°F | Resp 16 | Ht 73.0 in | Wt 216.0 lb

## 2023-08-10 DIAGNOSIS — Z23 Encounter for immunization: Secondary | ICD-10-CM | POA: Diagnosis not present

## 2023-08-10 DIAGNOSIS — I10 Essential (primary) hypertension: Secondary | ICD-10-CM | POA: Diagnosis not present

## 2023-08-10 NOTE — Progress Notes (Signed)
      Established patient visit   Patient: Brian Kirk   DOB: [birth year]   43 y.o. Male  MRN: [id number] Visit Date: 08/10/2023  Today's healthcare provider: Mila Merry, MD   Chief Complaint  Patient presents with   Hypertension    2 month bp f/u and labs,,no other conerns from the pt.   Subjective    Hypertension Pertinent negatives include no chest pain or palpitations.   Follow up htn and lipids. Feels well, taking amlodipine consistently. No perceived side effects. No complaints. Stays physically active and avoid sodium in diet, but gets a little more short winded with exercises. Home BP c/with office readings.   Lab Results  Component Value Date   NA 140 07/02/2022   K 5.3 (H) 07/02/2022   CREATININE 1.33 (H) 07/02/2022   EGFR 70 07/02/2022   GLUCOSE 108 (H) 07/02/2022   Lab Results  Component Value Date   CHOL 174 07/02/2022   HDL 37 (L) 07/02/2022   LDLCALC 116 (H) 07/02/2022   TRIG 118 07/02/2022   CHOLHDL 4.7 07/02/2022   The 10-year ASCVD risk score (Arnett DK, et al., 2019) is: 1.5%   Medications: Outpatient Medications Prior to Visit  Medication Sig   amLODipine (NORVASC) 10 MG tablet TAKE 1 TABLET BY MOUTH EVERY DAY   meloxicam (MOBIC) 15 MG tablet Take 1 tablet (15 mg total) by mouth daily as needed for pain.   No facility-administered medications prior to visit.    Review of Systems  Constitutional:  Negative for appetite change, chills and fever.  Respiratory:  Negative for cough, chest tightness and wheezing.   Cardiovascular:  Negative for chest pain and palpitations.  Gastrointestinal:  Negative for abdominal pain, nausea and vomiting.       Objective    BP 125/84 (BP Location: Left Arm, Patient Position: Sitting)   Pulse 73   Temp 98.1 F (36.7 C) (Oral)   Resp 16   Ht 6\' 1"  (1.854 m)   Wt 216 lb (98 kg)   SpO2 100%   BMI 28.50 kg/m    Physical Exam   General: Appearance:    Well developed, well nourished male in no  acute distress  Eyes:    PERRL, conjunctiva/corneas clear, EOM's intact       Lungs:     Clear to auscultation bilaterally, respirations unlabored  Heart:    Normal heart rate. Normal rhythm. No murmurs, rubs, or gallops.    MS:   All extremities are intact.    Neurologic:   Awake, alert, oriented x 3. No apparent focal neurological defect.         Assessment & Plan     1. Primary hypertension (Primary) Well controlled.  Continue current medications.  Needs 90 day refill amlodipine x 1 year if labs OK.  - Comprehensive metabolic panel - Lipid panel  2. Need for prophylactic vaccination using tetanus and diphtheria toxoids adsorbed (Td) vaccine  - Tdap vaccine greater than or equal to 7yo IM        Mila Merry, MD  [facility] [phone] (phone) [phone] (fax)  [facility]

## 2023-08-11 ENCOUNTER — Encounter: Payer: Self-pay | Admitting: Family Medicine

## 2023-08-11 LAB — COMPREHENSIVE METABOLIC PANEL
ALT: 27 IU/L (ref 0–44)
AST: 16 IU/L (ref 0–40)
Albumin: 4.9 g/dL (ref 4.1–5.1)
Alkaline Phosphatase: 61 IU/L (ref 44–121)
BUN/Creatinine Ratio: 11 (ref 9–20)
BUN: 14 mg/dL (ref 6–24)
Bilirubin Total: 0.6 mg/dL (ref 0.0–1.2)
CO2: 25 mmol/L (ref 20–29)
Calcium: 10.3 mg/dL — ABNORMAL HIGH (ref 8.7–10.2)
Chloride: 101 mmol/L (ref 96–106)
Creatinine, Ser: 1.33 mg/dL — ABNORMAL HIGH (ref 0.76–1.27)
Globulin, Total: 2.4 g/dL (ref 1.5–4.5)
Glucose: 90 mg/dL (ref 70–99)
Potassium: 5 mmol/L (ref 3.5–5.2)
Sodium: 141 mmol/L (ref 134–144)
Total Protein: 7.3 g/dL (ref 6.0–8.5)
eGFR: 69 mL/min/{1.73_m2} (ref >59)

## 2023-08-11 LAB — LIPID PANEL
Chol/HDL Ratio: 4.3 ratio (ref 0.0–5.0)
Cholesterol, Total: 199 mg/dL (ref 100–199)
HDL: 46 mg/dL (ref >39)
LDL Chol Calc (NIH): 122 mg/dL — ABNORMAL HIGH (ref 0–99)
Triglycerides: 174 mg/dL — ABNORMAL HIGH (ref 0–149)
VLDL Cholesterol Cal: 31 mg/dL (ref 5–40)

## 2023-09-22 ENCOUNTER — Other Ambulatory Visit: Payer: Self-pay | Admitting: Family Medicine

## 2023-09-22 DIAGNOSIS — I10 Essential (primary) hypertension: Secondary | ICD-10-CM

## 2023-09-22 MED ORDER — AMLODIPINE BESYLATE 10 MG PO TABS: 10.0000 mg | ORAL_TABLET | Freq: Every day | ORAL | 3 refills | Status: AC

## 2024-01-10 DIAGNOSIS — Z419 Encounter for procedure for purposes other than remedying health state, unspecified: Secondary | ICD-10-CM | POA: Diagnosis not present

## 2024-02-10 DIAGNOSIS — Z419 Encounter for procedure for purposes other than remedying health state, unspecified: Secondary | ICD-10-CM | POA: Diagnosis not present

## 2024-03-11 DIAGNOSIS — Z419 Encounter for procedure for purposes other than remedying health state, unspecified: Secondary | ICD-10-CM | POA: Diagnosis not present
